# Patient Record
Sex: Female | Born: 1975
Health system: Southern US, Community
[De-identification: ages and names within clinical notes are randomized; demographics above are authoritative.]

## PROBLEM LIST (undated history)

## (undated) DIAGNOSIS — Z803 Family history of malignant neoplasm of breast: Secondary | ICD-10-CM

## (undated) DIAGNOSIS — K219 Gastro-esophageal reflux disease without esophagitis: Secondary | ICD-10-CM

## (undated) DIAGNOSIS — E119 Type 2 diabetes mellitus without complications: Secondary | ICD-10-CM

## (undated) DIAGNOSIS — N939 Abnormal uterine and vaginal bleeding, unspecified: Secondary | ICD-10-CM

## (undated) HISTORY — PX: OTHER SURGICAL HISTORY: SHX169

## (undated) HISTORY — DX: Abnormal uterine and vaginal bleeding, unspecified: N93.9

## (undated) HISTORY — DX: Family history of malignant neoplasm of breast: Z80.3

## (undated) HISTORY — DX: Type 2 diabetes mellitus without complications: E11.9

## (undated) HISTORY — DX: Gastro-esophageal reflux disease without esophagitis: K21.9

## (undated) HISTORY — PX: FOOT SURGERY: SHX648

## (undated) HISTORY — PX: CHOLECYSTECTOMY: SHX55

---

## 1998-05-23 ENCOUNTER — Inpatient Hospital Stay (HOSPITAL_COMMUNITY): Admission: AD | Admit: 1998-05-23 | Discharge: 1998-05-23 | Payer: Self-pay | Admitting: Obstetrics and Gynecology

## 1998-11-10 ENCOUNTER — Inpatient Hospital Stay (HOSPITAL_COMMUNITY): Admission: AD | Admit: 1998-11-10 | Discharge: 1998-11-13 | Payer: Self-pay | Admitting: Obstetrics and Gynecology

## 2000-08-08 ENCOUNTER — Emergency Department (HOSPITAL_COMMUNITY): Admission: EM | Admit: 2000-08-08 | Discharge: 2000-08-08 | Payer: Self-pay | Admitting: Emergency Medicine

## 2002-03-23 ENCOUNTER — Other Ambulatory Visit: Admission: RE | Admit: 2002-03-23 | Discharge: 2002-03-23 | Payer: Self-pay | Admitting: Family Medicine

## 2002-05-04 ENCOUNTER — Encounter: Payer: Self-pay | Admitting: Family Medicine

## 2002-05-04 ENCOUNTER — Ambulatory Visit (HOSPITAL_COMMUNITY): Admission: RE | Admit: 2002-05-04 | Discharge: 2002-05-04 | Payer: Self-pay | Admitting: Family Medicine

## 2002-09-22 ENCOUNTER — Inpatient Hospital Stay (HOSPITAL_COMMUNITY): Admission: AD | Admit: 2002-09-22 | Discharge: 2002-09-24 | Payer: Self-pay | Admitting: Family Medicine

## 2003-07-15 ENCOUNTER — Other Ambulatory Visit: Admission: RE | Admit: 2003-07-15 | Discharge: 2003-07-15 | Payer: Self-pay | Admitting: Family Medicine

## 2004-09-09 ENCOUNTER — Emergency Department (HOSPITAL_COMMUNITY): Admission: EM | Admit: 2004-09-09 | Discharge: 2004-09-09 | Payer: Self-pay | Admitting: Emergency Medicine

## 2004-09-10 ENCOUNTER — Encounter (INDEPENDENT_AMBULATORY_CARE_PROVIDER_SITE_OTHER): Payer: Self-pay | Admitting: *Deleted

## 2004-09-10 ENCOUNTER — Inpatient Hospital Stay (HOSPITAL_COMMUNITY): Admission: EM | Admit: 2004-09-10 | Discharge: 2004-09-11 | Payer: Self-pay

## 2004-09-16 ENCOUNTER — Inpatient Hospital Stay (HOSPITAL_COMMUNITY): Admission: EM | Admit: 2004-09-16 | Discharge: 2004-09-23 | Payer: Self-pay | Admitting: Emergency Medicine

## 2005-01-21 ENCOUNTER — Emergency Department (HOSPITAL_COMMUNITY): Admission: EM | Admit: 2005-01-21 | Discharge: 2005-01-21 | Payer: Self-pay | Admitting: Family Medicine

## 2005-02-08 ENCOUNTER — Encounter: Admission: RE | Admit: 2005-02-08 | Discharge: 2005-02-08 | Payer: Self-pay | Admitting: Family Medicine

## 2005-04-20 ENCOUNTER — Emergency Department (HOSPITAL_COMMUNITY): Admission: EM | Admit: 2005-04-20 | Discharge: 2005-04-20 | Payer: Self-pay | Admitting: Family Medicine

## 2005-05-03 ENCOUNTER — Other Ambulatory Visit: Admission: RE | Admit: 2005-05-03 | Discharge: 2005-05-03 | Payer: Self-pay | Admitting: Family Medicine

## 2005-11-26 ENCOUNTER — Encounter: Admission: RE | Admit: 2005-11-26 | Discharge: 2005-11-26 | Payer: Self-pay | Admitting: Family Medicine

## 2006-08-26 ENCOUNTER — Other Ambulatory Visit: Admission: RE | Admit: 2006-08-26 | Discharge: 2006-08-26 | Payer: Self-pay | Admitting: Family Medicine

## 2006-11-28 ENCOUNTER — Encounter: Admission: RE | Admit: 2006-11-28 | Discharge: 2006-11-28 | Payer: Self-pay | Admitting: Family Medicine

## 2007-01-07 ENCOUNTER — Encounter: Admission: RE | Admit: 2007-01-07 | Discharge: 2007-01-07 | Payer: Self-pay | Admitting: Family Medicine

## 2008-01-20 ENCOUNTER — Encounter: Admission: RE | Admit: 2008-01-20 | Discharge: 2008-01-20 | Payer: Self-pay | Admitting: Family Medicine

## 2010-05-09 ENCOUNTER — Encounter: Admission: RE | Admit: 2010-05-09 | Discharge: 2010-05-09 | Payer: Self-pay | Admitting: Family Medicine

## 2011-01-07 ENCOUNTER — Encounter: Payer: Self-pay | Admitting: Family Medicine

## 2011-05-04 NOTE — H&P (Signed)
NAMEGLENISHA, GUNDRY NO.:  [id number]   MEDICAL RECORD NO.:  [id number]          PATIENT TYPE:  INP   LOCATION:  0104                         FACILITY:  [facility]   PHYSICIAN:  Ollen Gross. Vernell Morgans, M.D. DATE OF BIRTH:  05/14/1976   DATE OF ADMISSION:  09/10/2004  DATE OF DISCHARGE:                                HISTORY & PHYSICAL   Ms. [name] is a 35 year old white female, who came to the emergency  department complaining of abdominal pain for the last couple of months.  Yesterday, she had a severe attack of the pain in her epigastric area.  It  did not radiate anywhere.  It was associated with some nausea but no  vomiting.  She denies any fevers.  The pain has improved a little bit since  she has been here with pain medicine.  Otherwise, denies any fevers, chills,  chest pain, shortness of breath, diarrhea, or dysuria.  The rest of her  review of systems was unremarkable.   PAST MEDICAL HISTORY:  Kidney being smaller than the other.   PAST SURGICAL HISTORY:  Cystoscopy as a child.   MEDICATIONS:  None.   ALLERGIES:  PENICILLIN.   SOCIAL HISTORY:  She denies the use of alcohol or tobacco products.   FAMILY HISTORY:  Significant for diabetes in her mother.   PHYSICAL EXAMINATION:  VITAL SIGNS:  She is afebrile with stable vitals.  GENERAL:  She is a well-developed, well-nourished white female in no acute  distress.  SKIN:  Warm and dry with no jaundice.  EYES:  Extraocular muscles are intact.  Pupils equal, round, and reactive to  light.  Sclerae nonicteric.  NECK:  No bruits.  I cannot palpate any thyroid masses.  The trachea is  midline.  LUNGS:  Clear bilaterally with no use of accessory respiratory muscles.  HEART:  Regular rate and rhythm with an impulse in left chest.  ABDOMEN:  Soft with some mild epigastric tenderness but no guarding or  peritoneal signs.  No palpable mass or hepatosplenomegaly.  EXTREMITIES:  No cyanosis, clubbing, or edema with good  strength in the arms  and legs.  PSYCHOLOGIC:  She is alert and oriented x 3 with no evidence of any anxiety  or depression.   LABORATORY DATA:  On review of her lab work, her white count was normal.  Her liver functions were normal.  Review of her ultrasound with the  radiologist showed stones in her gallbladder but no gallbladder wall  thickening or ductal dilatation.   ASSESSMENT AND PLAN:  This is a 35 year old white female with symptomatic  gallstones.  I do think she would benefit from having her gallbladder  removed, and she would also like to have this done.  I have explained to her  in detail the risks and benefits of the operation as well as some of the  technical aspects, and she understands and wishes to proceed.  We will plan  to do this for her in the operating room this afternoon.      PST/MEDQ  D:  09/10/2004  T:  09/10/2004  Job:  [id number]

## 2011-05-04 NOTE — Discharge Summary (Signed)
[name], [name] NO.:  [id number]   MEDICAL RECORD NO.:  [id number]          PATIENT TYPE:  INP   LOCATION:  0379                         FACILITY:  [facility]   PHYSICIAN:  Jackie Plum, M.D.DATE OF BIRTH:  Jun 21, 1976   DATE OF ADMISSION:  09/16/2004  DATE OF DISCHARGE:  09/23/2004                                 DISCHARGE SUMMARY   DISCHARGE DIAGNOSES:  1.  Abdominal pain, nausea, and vomiting presumed secondary to viral      gastroenteritis.  2.  Post ERCP acute pancreatitis, resolved.  3.  History of allergy to PENICILLIN.   DISCHARGE MEDICATIONS:  1.  Protonix 40 mg p.o. daily.  2.  Darvocet-N 100 one to two tablets p.o. q.4-6h. p.r.n.   ACTIVITY:  As tolerated.   DIET:  Low-fat diet. No greasy or oily foods.   FOLLOWUP APPOINTMENT:  The patient will follow up with Dr. Chevis Pretty as  previously scheduled. She is to follow up with Dr. Leone Payor at 10:15 a.m. on  October 09, 2004, at [facility].   DISCHARGE LABORATORY:  WBC count 8.4, hemoglobin 13.4, hematocrit 39.6, MCV  85.0, platelet count 203,000.  Protime 11.7, INR 0.8, PTT 34. Sodium 136,  potassium 3.9, chloride 106, CO2 27, glucose 102, BUN 4, creatinine 0.6,  total bilirubin 1.1, alkaline phosphatase 173, SGOT 23, SGOT 108, total  protein 7.0, albumin 3.1, amylase 25, lipase 52.   REASON FOR HOSPITALIZATION:  Abdominal pain, nausea, and vomiting.   The patient is 35 year old lady who was discharged from the hospital after  hospitalization for laparoscopic cholecystectomy. At that time at least  gallstone with a nondilated bile duct was seen on ultrasound. Her LFTs were  within normal limits. She did not get intraoperative cholangiogram because  of her normal LFTs. The patient presented to the hospital because of nausea,  vomiting, and abdominal pain which was epigastric with chills, and a brief  syncopal episode. She had elevated ALT of 35, total bilirubin 1.4, with  normal amylase and  lipase. She had a HIDA scan was negative for any leak or  biliary obstruction. The patient gives a history of a sick at home with  nausea and vomiting without any fever or diarrhea. She was subsequently  admitted for abdominal pain, nausea, and vomiting probably secondary to  gastroenteritis which was deemed Vera with subsequent orthostatic  hypertension being the cause of her syncope. She was started on IV  supplementation with antibiotic coverage. The patient was seen in  consultation by GI medicine on September 18, 2004, and in view of concerns for  possible routine common bile stone she had an ERCP done which turned out to  be unremarkable. This was followed with an MRCP which was also negative.  Post ERCP the patient continued to have abdominal pain with new elevated  pancreatic enzyme. She was diagnosed with post ERCP pancreatitis and she was  continued on a regimen of bowel rest and supportive measures. Her pancreatic  enzymes and metabolic panel were monitored subsequently with improvement in  her symptomatology at the time of discharge. Over the last 48  hours the  patient feels that her abdominal pain has been going down unremarkably and  has been tolerating food without any significant problems. She has been on a  low-fat diet over the last 24 hours with good tolerance and she is deemed  adequate for discharge today. On rounds today the patient does not have any  fever, chills, shortness of breath, chest pain, or dizziness. Her  temperature is 98.0 degrees Fahrenheit, heart rate 66, respirations 20,  blood pressure 136/70, 100% on room air. She looks well. She is clinically  well hydrated. Lungs are clear to auscultation bilaterally. Cardiac reveals  a regular rate and rhythm without any gallops or murmur. Abdomen is soft.  She has minimal tenderness in her epigastric area. She has normoactive bowel  sounds. Her extremities are negative for any cords or edema. She is alert  and  oriented times three. She is clear for discharge today. On morning round  today I spent time discussing with the patient her discharge diagnoses, all  the workup that was done in the hospital, the results, and the rationale  behind this workup. She also asked some questions regarding plan of care on  an outpatient level, which I elaborated on above and all her questions were  satisfactorily answered. I spent more than 32 minutes this morning preparing  this patient for discharge.     Geor   GO/MEDQ  D:  09/23/2004  T:  09/23/2004  Job:  [id number]   cc:   Magnus Sinning. Dimple Casey, M.D.  [address] [place]  [place] [address]  Fax: [phone]

## 2011-05-04 NOTE — Consult Note (Signed)
[name], [name] NO.:  [id number]   MEDICAL RECORD NO.:  [id number]          PATIENT TYPE:  INP   LOCATION:  0379                         FACILITY:  [facility]   PHYSICIAN:  Iva Boop, M.D. LHCDATE OF BIRTH:  1976-07-10   DATE OF CONSULTATION:  09/18/2004  DATE OF DISCHARGE:                                   CONSULTATION   REFERRED BY:  Melissa L. Ladona Ridgel, MD   REASON FOR CONSULTATION:  Abdominal pain after cholecystectomy, elevated  LFT's.   HISTORY:  This is a 35 year old white woman that had a laparoscopic  cholecystectomy performed by Dr. Carolynne Edouard and Zachery Dakins on September 25. Prior  to that, she had been having recurrent epigastric pain. She had at least one  gallstone with a nondilated bile duct on ultrasound at that time. Her LFT's  were normal. She had a successful uneventful cholecystectomy. No  intraoperative cholangiogram was performed because of normal LFT's.  Then  approximately two days ago, she develop nausea and vomiting with bilious  emesis, epigastric pain, chills and a brief syncopal episode ensued.  Temperature to 103.2.  Her LFT's were normal except for an ALT of 75 and  bilirubin 1.4, normal amylase and lipase.  A HIDA scan was performed which  was negative for any leak or biliary obstruction. At that point, her white  count was 9. Today her white count is 6, hemoglobin 12. Alkaline phosphatase  148, AST 88, ALT 235.  She says she feels somewhat better but still having  some pain and soreness in the epigastrium. She said this pain is similar to  what her perceived gallbladder pain was as well. She has had a headache and  since she has been hospitalized it has been shown that she has a sick child  home with nausea and vomiting but no fever or diarrhea. The patient herself  has not had diarrhea. She takes no regular medications.  In the hospital,  she is on Protonix 40 mg IV q.d., Lovenox 40 mg q.h.s. with last dose given  last night,  morphine p.r.n., Phenergan p.r.n.  Other p.r.n. medications as  listed in the flow sheet.   ALLERGIES:  PENICILLIN.  She had a rash to IVP DYE many years ago.   PAST MEDICAL AND SURGICAL HISTORY:  Otherwise as above.   FAMILY HISTORY:  Positive for diabetes and coronary artery disease.   SOCIAL HISTORY:  She is married, three children, no tobacco or alcohol.   REVIEW OF SYMPTOMS:  As above and otherwise all systems negative.   PHYSICAL EXAMINATION:  GENERAL:  Reveals a pleasant young white woman in no  acute distress though appears mildly ill.  VITAL SIGNS:  Temperature 98.2, blood pressure 110/70, pulse 60's.  HEENT:  Eyes anicteric, mouth posterior pharynx free of lesions.  NECK:  Supple.  CHEST:  Clear.  HEART:  S1, S2, no murmurs or gallops.  ABDOMEN:  Soft, mildly tender in the epigastrium without rebound or mass or  guarding.  EXTREMITIES:  No peripheral edema.  SKIN:  No rash.  Around the perioral area though she does  have multiple  blisters consistent with fever blisters/herpes blisters.   LABORATORY DATA:  As above.   ASSESSMENT:  A 35 year old white woman one week status post cholecystectomy  for symptomatic cholelithiasis.  She had an uneventful cholecystectomy and  was improving and then developed recurrent pain and fever. Not noted above,  there is some headache and some features consistent with possible viral  gastroenteritis but it is unclear as to whether or not she has a retained  common duct stone as well. She had at least one gallstone reported on  ultrasound, the pathology specimen did not show gallstones though it showed  cholesterolosis and chronic cholecystitis. The possibility of a bowel leak  remained as well as choledocholithiasis though bowel leak much less likely  given the negative HIDA scan.   PLAN:  1.  Proceed with ERCP to investigate for possible retained common duct stone      and the very unlikely possibility of leak after cholecystectomy.   2.  Further plans pending that result and clinical course.   I have explained the risks, benefits and indications of ERCP to the patient  and she understands and agrees to proceed.   I appreciate the opportunity to care for this patient.      CEG/MEDQ  D:  09/18/2004  T:  09/19/2004  Job:  [id number]   cc:   Melissa L. Ladona Ridgel, MD  Fax: [phone]   Tula Nakayama, M.D.

## 2011-05-04 NOTE — Op Note (Signed)
NAMEALERIA, MAHEU NO.:  [id number]   MEDICAL RECORD NO.:  [id number]          PATIENT TYPE:  INP   LOCATION:  0471                         FACILITY:  [facility]   PHYSICIAN:  Ollen Gross. Vernell Morgans, M.D. DATE OF BIRTH:  09-12-1976   DATE OF PROCEDURE:  09/10/2004  DATE OF DISCHARGE:  09/11/2004                                 OPERATIVE REPORT   PREOPERATIVE DIAGNOSIS:  Gallstones.   POSTOPERATIVE DIAGNOSIS:  Gallstones.   PROCEDURE:  Laparoscopic cholecystectomy.   SURGEON:  Ollen Gross. Carolynne Edouard, M.D.   ASSISTANT:  Anselm Pancoast. Zachery Dakins, M.D.   ANESTHESIA:  General endotracheal.   PROCEDURE:  After informed consent was obtained, the patient was brought to  the operating room and placed in a supine position on the operating room  table.  After adequate induction of general anesthesia, the patient's  abdomen was prepped with Betadine and draped in the usual sterile manner.  The area below the umbilicus was infiltrated with 0.25% Marcaine.  A small  incision was made with a 15 blade knife.  This incision was carried down  through the subcutaneous tissue bluntly with a Kelly clamp and [employer]  retractors until the linea alba was identified.  The linea alba was incised  with a 15 blade knife, and each side was grasped with Kocher clamps and  elevated anteriorly.  The pre-peritoneal space was probed bluntly with a  hemostat until the peritoneum was opened, and access was gained to the  abdominal cavity.  A 0 Vicryl purse-string stitch was placed in the fascial  surrounding the opening.  A Hasson cannula was placed through the opening  and anchored in place with the previously placed Vicryl purse-string stitch.  The abdomen was then insufflated with carbon dioxide without difficulty.  The patient was placed in a head-up position and rotated slightly with the  right side up.  The laparoscope was inserted through the Hasson cannula, and  the right upper quadrant was inspected  with the dome of the gallbladder and  liver readily identified.  Next, the epigastric region was infiltrated with  0.25% Marcaine.  A small incision was made with a 15 blade knife, and a 10  mm port was placed bluntly through this incision into the abdominal cavity  under direct vision.  Next, sites were chosen laterally on the right side of  the abdomen with placement of 5 mm ports.  Each of these areas were  infiltrated with 0.25% Marcaine.  Small stab incisions were made with a 15  blade knife, and 5 mm ports were placed bluntly through these incisions into  the abdominal cavity under direct vision.  A blunt grasper was placed  through the lateral-most 5 mm port and used to grasp the dome of the  gallbladder and elevate it anteriorly and superiorly.  Another blunt grasper  was placed through the other 5 mm port and used to retract on the body and  neck of the gallbladder.  A dissector was placed through the epigastric  port.  Using electrocautery, the peritoneal reflection at the gallbladder  neck area was  opened.  Blunt dissection was then carried down into this area  until the gallbladder neck and cystic duct junction was readily identified,  and a good window was created.  The anatomy was easy to see, and care was  taken to keep the common duct medial to this dissection.  The patient had an  IV dye allergy, and since the anatomy was easy to see and her liver  functions were normal, it was decided to forgo the cholangiogram.  Two clips  were placed proximal and distally on the cystic duct, and the duct was  divided between the two sets of clips.  Posterior to this, the cystic artery  was identified and again dissected bluntly in a circumferential manner until  a good window was created.  Two clips were placed proximally, one distally  on the artery, and the artery was divided between the two.  Next, the  laparoscopic hook cautery device was used to separate the gallbladder from  the  liver bed.  Prior to completely detaching the gallbladder from the liver  bed, the liver bed was inspected, and several small bleeding points were  coagulated with the electrocautery until the area was completely hemostatic.  The gallbladder was then detached the rest of the way from the liver bed  using the electrocautery without difficulty.  The laparoscope was then moved  to the epigastric port.  A gallbladder grasper was placed through the Hasson  cannula and used to grasp the neck of the gallbladder.  The gallbladder was  then removed with the Hasson cannula  through the infraumbilical port  without difficulty.  The fascial defect was closed with the previously  placed Vicryl purse-string stitch as well as with another interrupted 0  Vicryl stitch.  The abdomen was then irrigated with copious amounts of  saline until the effluent was clear.  The liver bed was inspected again and  found to be hemostatic.  The ports were then all removed under direct vision  and were found to be hemostatic.  The gas was allowed to escape.  The skin  incisions were all closed with interrupted 4-0 Monocryl subcuticular  stitches.  Benzoin, Steri-Strips, and sterile dressings were applied.  The  patient tolerated the procedure well.  At the end of the case, all needle,  sponge, and instrument counts were correct.  Patient was then awakened and  taken to the recovery room in stable condition.      PST/MEDQ  D:  09/11/2004  T:  09/11/2004  Job:  [id number]

## 2011-05-04 NOTE — H&P (Signed)
[name], [name] NO.:  [id number]   MEDICAL RECORD NO.:  [id number]          PATIENT TYPE:  INP   LOCATION:  0379                         FACILITY:  [facility]   PHYSICIAN:  Corinna L. Lendell Caprice, MDDATE OF BIRTH:  [birth year]   DATE OF ADMISSION:  09/16/2004  DATE OF DISCHARGE:                                HISTORY & PHYSICAL   CHIEF COMPLAINT:  Weakness.   HISTORY OF PRESENT ILLNESS:  [name] is a 35 year old unassigned patient  who presents to the emergency room with her husband.  She had a laparoscopic  cholecystectomy on September 10, 2004, did well postoperatively until this  morning.  She reports that she had bilious emesis several times today and  some epigastric pain.  She also had chills and went to soak in a hot  bathtub.  While in the bathtub, she became so weak that when her husband  tried to assist her out of the bath she had a syncopal episode lasting  approximately 5-10 seconds.  He reports that her eyes rolled back into her  head, and she quickly regained consciousness.  She has had no sick contacts.  She has not eaten any foods which could have been the culprit.  She is also  complaining of headaches since her fever started.   PAST MEDICAL HISTORY:  As above.  Otherwise negative.   MEDICATIONS:  Tylox as needed.   SOCIAL HISTORY:  The patient works on an Theatre stage manager at [employer].  She is the  mother of three and here with her husband.  She does not smoke, drink, or  use drugs.   FAMILY HISTORY:  Her mother has diabetes, peripheral vascular disease,  coronary artery disease.  She does not know her father.   REVIEW OF SYSTEMS:  As above.  Otherwise negative.   PHYSICAL EXAMINATION:  TEMPERATURE:  103.4.  BLOOD PRESSURE/HEART RATE:  Lying, 103/64, with a heart rate of 107.  Standing, her blood pressure dropped to 85/55, and her heart rate increased  to 130.  RESPIRATORY RATE:  22.  OXYGEN SATURATION:  99%.  GENERAL:  The patient is an  uncomfortable-appearing white female.  HEENT:  Normocephalic, atraumatic.  Pupils equal, round, and reactive to  light.  Conjunctivae are normal.  She has slightly dry mucous membranes.  Ketotic breath.  NECK:  Supple.  No lymphadenopathy.  LUNGS:  Clear to auscultation bilaterally, without wheezes, rhonchi, or  rales.  CARDIOVASCULAR:  Regular rate and rhythm, without murmurs, gallops, or rubs.  ABDOMEN:  She has several trocar incisions which do not have any drainage or  erythema.  Her abdomen is soft, with some slight lower abdominal tenderness.  No rebound tenderness.  No guarding.  GU/RECTAL:  Deferred.  EXTREMITIES:  No clubbing, cyanosis, or edema.  SKIN:  She has some redness about the neck, but no rash per se.  NEUROLOGIC:  The patient is somewhat groggy after nausea medicines, but  oriented x 3.  Cranial nerves and sensorimotor exam are intact.  PSYCHIATRIC:  Normal affect.   LABORATORIES:  CBC is unremarkable.  Complete metabolic panel is  significant  for SGPT 75, total bilirubin 1.4, otherwise fairly unremarkable.  UA is  negative.  Chest x-ray negative.  HIDA scan shows no bile leak or biliary  obstruction.   ASSESSMENT AND PLAN:  1.  Fever and vomiting, with syncope due to orthostatic hypotension:  The      patient's urinalysis and chest x-ray are negative.  I suspect this is a      viral gastroenteritis.  However, I will check blood cultures and a      lipase.  As she is still very weak and cannot even sit up without      assistance, she will be admitted, get IV fluids, antiemetics.  2.  Recent laparoscopic cholecystectomy, with a normal HIDA scan and benign      abdominal exam.  Apparently, Dr. Jennette Kettle has called Dr. Luan Pulling, who      is on call for Dr. Carolynne Edouard and agrees that this not surgical.     Cori   CLS/MEDQ  D:  09/16/2004  T:  09/17/2004  Job:  [id number]   cc:   Ollen Gross. Vernell Morgans, M.D.  [address]  [place]  [place] [address]  Fax: [phone]

## 2011-08-30 ENCOUNTER — Other Ambulatory Visit: Payer: Self-pay | Admitting: Obstetrics

## 2011-08-30 DIAGNOSIS — Z1231 Encounter for screening mammogram for malignant neoplasm of breast: Secondary | ICD-10-CM

## 2011-09-05 ENCOUNTER — Ambulatory Visit
Admission: RE | Admit: 2011-09-05 | Discharge: 2011-09-05 | Disposition: A | Discharge status: Home / Self Care | Payer: BC Managed Care – PPO | Source: Ambulatory Visit | Attending: Obstetrics | Admitting: Obstetrics

## 2011-09-05 DIAGNOSIS — Z1231 Encounter for screening mammogram for malignant neoplasm of breast: Secondary | ICD-10-CM

## 2012-03-12 ENCOUNTER — Ambulatory Visit
Admission: RE | Admit: 2012-03-12 | Discharge: 2012-03-12 | Disposition: A | Discharge status: Home / Self Care | Payer: 59 | Source: Ambulatory Visit | Attending: Family Medicine | Admitting: Family Medicine

## 2012-03-12 ENCOUNTER — Other Ambulatory Visit: Payer: Self-pay | Admitting: Family Medicine

## 2012-09-30 ENCOUNTER — Ambulatory Visit: Payer: 59 | Attending: Podiatry | Admitting: Physical Therapy

## 2012-09-30 DIAGNOSIS — R5381 Other malaise: Secondary | ICD-10-CM | POA: Insufficient documentation

## 2012-09-30 DIAGNOSIS — IMO0001 Reserved for inherently not codable concepts without codable children: Secondary | ICD-10-CM | POA: Insufficient documentation

## 2012-09-30 DIAGNOSIS — M25579 Pain in unspecified ankle and joints of unspecified foot: Secondary | ICD-10-CM | POA: Insufficient documentation

## 2012-09-30 DIAGNOSIS — R269 Unspecified abnormalities of gait and mobility: Secondary | ICD-10-CM | POA: Insufficient documentation

## 2012-10-03 ENCOUNTER — Ambulatory Visit: Payer: 59 | Admitting: Physical Therapy

## 2012-10-10 ENCOUNTER — Ambulatory Visit: Payer: 59 | Admitting: Physical Therapy

## 2012-10-13 ENCOUNTER — Ambulatory Visit: Payer: 59 | Admitting: Physical Therapy

## 2012-10-16 ENCOUNTER — Ambulatory Visit: Payer: 59 | Admitting: Physical Therapy

## 2012-10-21 ENCOUNTER — Encounter: Payer: 59 | Admitting: Physical Therapy

## 2012-10-22 ENCOUNTER — Ambulatory Visit: Payer: 59 | Attending: Podiatry | Admitting: Physical Therapy

## 2012-10-22 DIAGNOSIS — R269 Unspecified abnormalities of gait and mobility: Secondary | ICD-10-CM | POA: Insufficient documentation

## 2012-10-22 DIAGNOSIS — IMO0001 Reserved for inherently not codable concepts without codable children: Secondary | ICD-10-CM | POA: Insufficient documentation

## 2012-10-22 DIAGNOSIS — R5381 Other malaise: Secondary | ICD-10-CM | POA: Insufficient documentation

## 2012-10-22 DIAGNOSIS — M25579 Pain in unspecified ankle and joints of unspecified foot: Secondary | ICD-10-CM | POA: Insufficient documentation

## 2012-10-28 ENCOUNTER — Ambulatory Visit: Payer: 59 | Admitting: Physical Therapy

## 2012-10-30 ENCOUNTER — Encounter: Payer: 59 | Admitting: Physical Therapy

## 2012-11-24 ENCOUNTER — Ambulatory Visit: Payer: 59 | Attending: Podiatry | Admitting: Physical Therapy

## 2012-11-24 DIAGNOSIS — IMO0001 Reserved for inherently not codable concepts without codable children: Secondary | ICD-10-CM | POA: Insufficient documentation

## 2012-11-24 DIAGNOSIS — M25579 Pain in unspecified ankle and joints of unspecified foot: Secondary | ICD-10-CM | POA: Insufficient documentation

## 2012-11-24 DIAGNOSIS — R5381 Other malaise: Secondary | ICD-10-CM | POA: Insufficient documentation

## 2012-11-24 DIAGNOSIS — R269 Unspecified abnormalities of gait and mobility: Secondary | ICD-10-CM | POA: Insufficient documentation

## 2012-11-27 ENCOUNTER — Ambulatory Visit: Payer: 59 | Admitting: Physical Therapy

## 2012-12-01 ENCOUNTER — Ambulatory Visit: Payer: 59 | Admitting: *Deleted

## 2012-12-05 ENCOUNTER — Encounter: Payer: 59 | Admitting: *Deleted

## 2012-12-08 ENCOUNTER — Ambulatory Visit: Payer: 59 | Admitting: *Deleted

## 2012-12-12 ENCOUNTER — Ambulatory Visit: Payer: 59 | Admitting: Physical Therapy

## 2012-12-15 ENCOUNTER — Ambulatory Visit: Payer: 59 | Admitting: *Deleted

## 2013-11-06 ENCOUNTER — Encounter (INDEPENDENT_AMBULATORY_CARE_PROVIDER_SITE_OTHER): Payer: Self-pay

## 2013-11-06 ENCOUNTER — Ambulatory Visit (INDEPENDENT_AMBULATORY_CARE_PROVIDER_SITE_OTHER): Payer: Managed Care, Other (non HMO)

## 2013-11-06 ENCOUNTER — Ambulatory Visit (INDEPENDENT_AMBULATORY_CARE_PROVIDER_SITE_OTHER): Payer: Managed Care, Other (non HMO) | Admitting: Family Medicine

## 2013-11-06 ENCOUNTER — Encounter: Payer: Self-pay | Admitting: Family Medicine

## 2013-11-06 VITALS — BP 118/74 | HR 63 | Temp 97.9°F | Ht 68.0 in | Wt 239.0 lb

## 2013-11-06 DIAGNOSIS — M25571 Pain in right ankle and joints of right foot: Secondary | ICD-10-CM

## 2013-11-06 DIAGNOSIS — M25579 Pain in unspecified ankle and joints of unspecified foot: Secondary | ICD-10-CM

## 2013-11-06 MED ORDER — PREDNISONE 50 MG PO TABS: ORAL_TABLET | ORAL | Status: DC

## 2013-11-06 NOTE — Progress Notes (Signed)
  Subjective:    Patient ID: [name], female    DOB: 1976/02/18, 37 y.o.   MRN: [id number]  HPI  R foot/ankle pain x 2-3 months.  Pt reports a prior hx/o R heel spurs s/p surgery  Patient states that she have recurrence of pain about 6 months after the surgery. Patient has been seen by other specialists as well as had therapeutic injections to the right heel with minimal improvement in pain. Patient does report that she stands on her feet for greater than 12 hours of time at her job. Over the past 2 months she's had progressive right heel pain that has radiated into the medial ankle with some first toe  Tingling Has been able to bear weight. Has been using over-the-counter NSAIDs with minimal small improvement in symptoms. Does have custom orthotics in place. Her last discussion with Dr. Allena Katz included the option of the patient likely needing surgery again. .Review of Systems  All other systems reviewed and are negative.       Objective:   Physical Exam  Constitutional: She appears well-developed and well-nourished.  HENT:  Head: Normocephalic and atraumatic.  Eyes: Conjunctivae are normal. Pupils are equal, round, and reactive to light.  Neck: Normal range of motion.  Cardiovascular: Normal rate and regular rhythm.   Pulmonary/Chest: Effort normal and breath sounds normal.  Abdominal: Soft.  Musculoskeletal:       Feet:  + TTP over affected area  + TTP over plantar fascia with dorsiflexion of toes.  Mild TTP over distribution of tarsal tunnel Full ROM  Neurovascularly inta  Neurological: She is alert.  Skin: Skin is warm.   [facility] reading (PRIMARY) by  Dr. Alvester Morin  Preliminary with noted heel spur. No acute fractures or dislocations preliminarily noted.                                          Assessment & Plan:  Pain in joint, ankle and foot, right - Plan: DG Foot Complete Right, Ambulatory referral to Podiatry, predniSONE (DELTASONE) 50 MG tablet  DDx for  sxs is fairly broad including post surgical issue, plantar fasciitis, tarsal tunnel, heel spur recurrence.  Patient has had a fairly extensive workup as well as management of this issue in the past. Will place patient on short course of prednisone for symptomatic relief. Plan for followup with orthopedics. A second operation was a discussion at her last visit with orthopedics. May benefit long-term from physical therapy  nerve modulating medication like Neurontin. Patient expressed understanding and is agreeable to plan Followup as needed.

## 2014-08-30 ENCOUNTER — Telehealth: Payer: Self-pay | Admitting: Family Medicine

## 2014-08-30 NOTE — Telephone Encounter (Signed)
appt rescheduled for 9/29 with [facility]

## 2014-09-14 ENCOUNTER — Ambulatory Visit (INDEPENDENT_AMBULATORY_CARE_PROVIDER_SITE_OTHER): Payer: BC Managed Care – PPO | Admitting: Family Medicine

## 2014-09-14 ENCOUNTER — Encounter: Payer: Self-pay | Admitting: Family Medicine

## 2014-09-14 VITALS — BP 119/80 | HR 74 | Temp 97.8°F | Ht 67.0 in | Wt 252.0 lb

## 2014-09-14 DIAGNOSIS — Z139 Encounter for screening, unspecified: Secondary | ICD-10-CM

## 2014-09-14 DIAGNOSIS — R635 Abnormal weight gain: Secondary | ICD-10-CM

## 2014-09-14 DIAGNOSIS — Z0000 Encounter for general adult medical examination without abnormal findings: Secondary | ICD-10-CM

## 2014-09-14 LAB — GLUCOSE, POCT (MANUAL RESULT ENTRY): POC Glucose: 101 mg/dl — AB (ref 70–99)

## 2014-09-14 LAB — POCT GLYCOSYLATED HEMOGLOBIN (HGB A1C): HEMOGLOBIN A1C: 5.8

## 2014-09-14 NOTE — Patient Instructions (Signed)

## 2014-09-14 NOTE — Progress Notes (Signed)
Subjective:    Patient ID: [name], female    DOB: [birth year], 38 y.o.   MRN: [id number]  HPI  38 yr old female for physical as per her insurance company.  No complaints.  Has FH of breast cancer and has been having mammograms for some years.    Review of Systems  Constitutional: Negative.   HENT: Negative.   Eyes: Negative.   Respiratory: Negative.   Cardiovascular: Negative.   Gastrointestinal: Negative.   Endocrine: Negative.   Genitourinary: Negative.   Musculoskeletal: Positive for myalgias.       Bilateral thumb pain  Skin: Rash: not true rash but healing intertrigo.  Hematological: Negative.   Psychiatric/Behavioral: Negative.        Objective:   Physical Exam  Constitutional: She is oriented to person, place, and time. She appears well-developed and well-nourished.  Eyes: Conjunctivae and EOM are normal.  Neck: Normal range of motion. Neck supple.  Cardiovascular: Normal rate, regular rhythm and normal heart sounds.   Pulmonary/Chest: Effort normal and breath sounds normal.  Abdominal: Soft. Bowel sounds are normal.  Musculoskeletal: Normal range of motion.  Neurological: She is alert and oriented to person, place, and time. She has normal reflexes.  Skin: Skin is warm and dry.  Psychiatric: She has a normal mood and affect. Her behavior is normal. Thought content normal.    BP 119/80  Pulse 74  Temp(Src) 97.8 F (36.6 C) (Oral)  Ht 5\' 7"  (1.702 m)  Wt 252 lb (114.306 kg)  BMI 39.46 kg/m2  LMP 09/02/2014      Assessment & Plan:  1. Screening Besides obesity seems healthy - POCT glycosylated hemoglobin (Hb A1C) - POCT glucose (manual entry) - Lipi  Patient ID: [name] MRN: [id number], DOB: [birth year], 38 y.o. Date of Encounter: 09/14/2014, 2:42 PM  Primary Physician: Frederica KusterMILLER, STEPHEN M, MD  Chief Complaint:   HPI: 38 y.o. year old female with history below presents with    No past medical history on file.   Home Meds: Prior  to Admission medications   Not on File    Allergies:  Allergies  Allergen Reactions  . Ivp Dye [Iodinated Diagnostic Agents]   . Penicillins     History   Social History  . Marital Status: Divorced    Spouse Name: N/A    Number of Children: N/A  . Years of Education: N/A   Occupational History  . Not on file.   Social History Main Topics  . Smoking status: Never Smoker   . Smokeless tobacco: Not on file  . Alcohol Use: No  . Drug Use: No  . Sexual Activity: Not on file   Other Topics Concern  . Not on file   Social History Narrative  . No narrative on file     Review of Systems: Constitutional: negative for chills, fever, night sweats, weight changes, or fatigue  HEENT: negative for vision changes, hearing loss, congestion, rhinorrhea, ST, epistaxis, or sinus pressure Cardiovascular: negative for chest pain or palpitations Respiratory: negative for hemoptysis, wheezing, shortness of breath, or cough Abdominal: negative for abdominal pain, nausea, vomiting, diarrhea, or constipation Dermatological: negative for rash Neurologic: negative for headache, dizziness, or syncope All other systems reviewed and are otherwise negative with the exception to those above and in the HPI.   Physical Exam: Blood pressure 119/80, pulse 74, temperature 97.8 F (36.6 C), temperature source Oral, height 5\' 7"  (1.702 m), weight 252 lb (114.306 kg), last menstrual period 09/02/2014.,  Body mass index is 39.46 kg/(m^2). General: Well developed, well nourished, in no acute distress. Head: Normocephalic, atraumatic, eyes without discharge, sclera non-icteric, nares are without discharge. Bilateral auditory canals clear, TM's are without perforation, pearly grey and translucent with reflective cone of light bilaterally. Oral cavity moist, posterior pharynx without exudate, erythema, peritonsillar abscess, or post nasal drip.  Neck: Supple. No thyromegaly. Full ROM. No lymphadenopathy. Lungs:  Clear bilaterally to auscultation without wheezes, rales, or rhonchi. Breathing is unlabored. Heart: RRR with S1 S2. No murmurs, rubs, or gallops appreciated. Abdomen: Soft, non-tender, non-distended with normoactive bowel sounds. No hepatomegaly. No rebound/guarding. No obvious abdominal masses. Msk:  Strength and tone normal for age. Extremities/Skin: Warm and dry. No clubbing or cyanosis. No edema. No rashes or suspicious lesions. Neuro: Alert and oriented X 3. Moves all extremities spontaneously. Gait is normal. CNII-XII grossly in tact. Psych:  Responds to questions appropriately with a normal affect.   Labs:   ASSESSMENT AND PLAN:  38 y.o. year old female with    09/14/2014 2:42 PM

## 2014-09-15 ENCOUNTER — Telehealth: Payer: Self-pay | Admitting: Family Medicine

## 2014-09-15 LAB — LIPID PANEL
CHOL/HDL RATIO: 3.1 ratio (ref 0.0–4.4)
Cholesterol, Total: 132 mg/dL (ref 100–199)
HDL: 42 mg/dL (ref >39)
LDL CALC: 77 mg/dL (ref 0–99)
TRIGLYCERIDES: 65 mg/dL (ref 0–149)
VLDL Cholesterol Cal: 13 mg/dL (ref 5–40)

## 2014-09-15 NOTE — Telephone Encounter (Signed)
Pt aware of lab results and needs results faxed to [phone].  Today is the deadline for these results to be faxed.  Call Candida to let her know this has been [phone]--[phone].   rs

## 2014-09-15 NOTE — Telephone Encounter (Signed)
Patient aware has been faxed.

## 2014-09-15 NOTE — Telephone Encounter (Signed)
Message copied by Azalee CourseFULP, ASHLEY on Wed Sep 15, 2014  9:39 AM ------      Message from: Frederica KusterMILLER, STEPHEN M      Created: Tue Sep 14, 2014  4:30 PM       Both blood sugar and hemoglobin A1c are consistent with prediabetes diagnosis. Patient should be counseled to watch her carbohydrate intake and lose weight. We have discussed this at her visit. ------

## 2014-09-16 ENCOUNTER — Telehealth: Payer: Self-pay | Admitting: Family Medicine

## 2014-09-16 ENCOUNTER — Encounter: Payer: Managed Care, Other (non HMO) | Admitting: Family Medicine

## 2014-09-16 NOTE — Telephone Encounter (Signed)
Message copied by Azalee CourseFULP, Mirayah Wren on Thu Sep 16, 2014  8:33 AM ------      Message from: Frederica KusterMILLER, STEPHEN M      Created: Thu Sep 16, 2014  8:00 AM       Lipids are good; no treatment not needed ------

## 2015-11-17 ENCOUNTER — Ambulatory Visit (INDEPENDENT_AMBULATORY_CARE_PROVIDER_SITE_OTHER): Payer: BLUE CROSS/BLUE SHIELD | Admitting: Pediatrics

## 2015-11-17 ENCOUNTER — Encounter: Payer: Self-pay | Admitting: Pediatrics

## 2015-11-17 VITALS — BP 123/79 | HR 75 | Temp 97.5°F | Ht 67.0 in | Wt 250.0 lb

## 2015-11-17 DIAGNOSIS — Z6839 Body mass index (BMI) 39.0-39.9, adult: Secondary | ICD-10-CM | POA: Diagnosis not present

## 2015-11-17 DIAGNOSIS — R739 Hyperglycemia, unspecified: Secondary | ICD-10-CM

## 2015-11-17 DIAGNOSIS — Z0000 Encounter for general adult medical examination without abnormal findings: Secondary | ICD-10-CM

## 2015-11-17 DIAGNOSIS — Z6838 Body mass index (BMI) 38.0-38.9, adult: Secondary | ICD-10-CM

## 2015-11-17 DIAGNOSIS — E669 Obesity, unspecified: Secondary | ICD-10-CM | POA: Insufficient documentation

## 2015-11-17 NOTE — Progress Notes (Signed)
    Subjective:    Patient ID: [name], female    DOB: 01-13-1976, 39 y.o.   MRN: [id number]  CC: [name]  HPI: [name] is a 39 y.o. female presenting for [name]   Mom died 2 weeks ago from breast ca Pt's mood doing ok  Family has been very supportive Here for [name] today No other complaints Mom had breast ca at 27yo Mammogram UTD, got over the summer.  Pap smear this year apprx 05/2015 No fam hx of colon ca Exercise--walking regularly, also member at the gym but doesn't go regularly Eating fruits and vegetables Never smoker   Depression screen [facility] 2/9 11/17/2015 09/14/2014  Decreased Interest 0 0  Down, Depressed, Hopeless 0 0  PHQ - 2 Score 0 0   History  Smoking status  . Never Smoker   Smokeless tobacco  . Not on file    Relevant past medical, surgical, family and social history reviewed and updated as indicated. Interim medical history since our last visit reviewed. Allergies and medications reviewed and updated.    ROS: All systems negative other than what is in HPI  Past Medical History Patient Active Problem List   Diagnosis Date Noted  . BMI 39.0-39.9,adult 11/17/2015    Current Outpatient Prescriptions  Medication Sig Dispense Refill  . fexofenadine (ALLEGRA) 180 MG tablet Take 180 mg by mouth.     No current facility-administered medications for this visit.       Objective:    BP 123/79 mmHg  Pulse 75  Temp(Src) 97.5 F (36.4 C) (Oral)  Ht $R'5\' 7"'Nr$  (1.702 m)  Wt 250 lb (113.399 kg)  BMI 39.15 kg/m2  Wt Readings from Last 3 Encounters:  11/17/15 250 lb (113.399 kg)  09/14/14 252 lb (114.306 kg)  11/06/13 239 lb (108.41 kg)    Gen: NAD, alert, cooperative with exam, NCAT EYES: EOMI, no scleral injection or icterus ENT:  TMs pearly gray b/l, OP without erythema LYMPH: no cervical LAD CV: NRRR, normal S1/S2, no murmur, distal pulses 2+ b/l Resp: CTABL, no wheezes, normal WOB Abd: +BS, soft, NTND. no guarding or organomegaly Ext: No  edema, warm Neuro: Alert and oriented, strength equal b/l UE and LE, coordination grossly normal MSK: normal muscle bulk     Assessment & Plan:    [name] was seen today for [name]. Overall doing well. Working on decreasing weight through healthy lifestyles. Discussed strategies with pt.  Diagnoses and all orders for this visit:  Encounter for preventive health examination -     Lipid panel -     BMP8+EGFR  BMI 39.0-39.9,adult    Follow up plan: Return in about 1 year (around 11/16/2016).  Assunta Found, MD [facility] 11/17/2015, 10:38 AM

## 2015-11-18 LAB — BMP8+EGFR
BUN / CREAT RATIO: 18 (ref 9–23)
BUN: 11 mg/dL (ref 6–24)
CO2: 22 mmol/L (ref 18–29)
Calcium: 9 mg/dL (ref 8.7–10.2)
Chloride: 98 mmol/L (ref 97–106)
Creatinine, Ser: 0.61 mg/dL (ref 0.57–1.00)
GFR calc Af Amer: 131 mL/min/{1.73_m2} (ref >59)
GFR calc non Af Amer: 114 mL/min/{1.73_m2} (ref >59)
GLUCOSE: 146 mg/dL — AB (ref 65–99)
POTASSIUM: 4.2 mmol/L (ref 3.5–5.2)
SODIUM: 135 mmol/L — AB (ref 136–144)

## 2015-11-18 LAB — LIPID PANEL
Chol/HDL Ratio: 3.3 ratio units (ref 0.0–4.4)
Cholesterol, Total: 147 mg/dL (ref 100–199)
HDL: 44 mg/dL (ref >39)
LDL CALC: 87 mg/dL (ref 0–99)
Triglycerides: 78 mg/dL (ref 0–149)
VLDL CHOLESTEROL CAL: 16 mg/dL (ref 5–40)

## 2015-11-24 NOTE — Addendum Note (Signed)
Addended by: Johna SheriffVINCENT, Sharalee Witman L on: 11/24/2015 08:14 AM   Modules accepted: Orders

## 2015-11-25 ENCOUNTER — Telehealth: Payer: Self-pay | Admitting: Pediatrics

## 2015-11-25 NOTE — Telephone Encounter (Signed)
Stp and advised she doesn't need an appt and it's not a blood draw but a quick finger stick for the HGB A1C. Pt voiced understanding.

## 2015-12-05 ENCOUNTER — Other Ambulatory Visit: Payer: BLUE CROSS/BLUE SHIELD

## 2015-12-05 LAB — POCT GLYCOSYLATED HEMOGLOBIN (HGB A1C): HEMOGLOBIN A1C: 6.1

## 2015-12-05 NOTE — Progress Notes (Signed)
Lab only 

## 2016-02-22 ENCOUNTER — Ambulatory Visit (INDEPENDENT_AMBULATORY_CARE_PROVIDER_SITE_OTHER): Payer: BLUE CROSS/BLUE SHIELD | Admitting: Family Medicine

## 2016-02-22 ENCOUNTER — Encounter: Payer: Self-pay | Admitting: Family Medicine

## 2016-02-22 VITALS — BP 125/75 | HR 71 | Temp 98.4°F | Ht 67.0 in | Wt 255.4 lb

## 2016-02-22 DIAGNOSIS — J0130 Acute sphenoidal sinusitis, unspecified: Secondary | ICD-10-CM

## 2016-02-22 DIAGNOSIS — H6503 Acute serous otitis media, bilateral: Secondary | ICD-10-CM | POA: Diagnosis not present

## 2016-02-22 MED ORDER — CEFDINIR 300 MG PO CAPS
300.0000 mg | ORAL_CAPSULE | Freq: Two times a day (BID) | ORAL | Status: DC
Start: 2016-02-22 — End: 2016-10-25

## 2016-02-22 NOTE — Progress Notes (Signed)
Patient ID: [name], female   DOB: 12-23-1975, 40 y.o.   MRN: [id number]  Primary Physician: Frederica Kuster, MD  Chief Complaint: 40 year old female with a three-week history of right ear pressure and pain in the back of her head on the right side. The left ear also has some fullness. She has some mild cough but the pressure in her right ear and head is most concerning. She has been taking Allegra without relief.      History reviewed. No pertinent past medical history.   Home Meds: Prior to Admission medications   Medication Sig Start Date End Date Taking? Authorizing Provider  fexofenadine (ALLEGRA) 180 MG tablet Take 180 mg by mouth.   Yes Historical Provider, MD    Allergies:  Allergies  Allergen Reactions  . Ivp Dye [Iodinated Diagnostic Agents]   . Penicillins     Social History   Social History  . Marital Status: Divorced    Spouse Name: N/A  . Number of Children: N/A  . Years of Education: N/A   Occupational History  . Not on file.   Social History Main Topics  . Smoking status: Never Smoker   . Smokeless tobacco: Not on file  . Alcohol Use: No  . Drug Use: No  . Sexual Activity: Not on file   Other Topics Concern  . Not on file   Social History Narrative     Review of Systems Constitutional: negative for chills, fever, night sweats, weight changes, or fatigue  HEENT: negative for vision changes, hearing loss, congestion, rhinorrhea, ST, epistaxis,has sinus pressure Cardiovascular: negative for chest pain or palpitations Respiratory: negative for hemoptysis, wheezing, shortness of breath, or cough Abdominal: negative for abdominal pain, nausea, vomiting, diarrhea, or constipation Dermatological: negative for rash Neurologic: negative for headache, dizziness, or syncope All other systems reviewed and are otherwise negative with the exception to those above and in the HPI.   Physical Exam: Blood pressure 125/75, pulse 71, temperature 98.4 F  (36.9 C), temperature source Oral, height  (1.702 m), weight 255 lb 6.4 oz (115.849 kg), last menstrual period 02/20/2016., Body mass index is 39.99 kg/(m^2). General: Well developed, well nourished, in no acute distress. Head: Normocephalic, atraumatic, eyes without discharge, sclera non-icteric, nares are without discharge. Bilateral auditory canals clear, TM's are without perforation, pearly grey and translucent without reflective cone of light bilaterally. Oral cavity moist, posterior pharynx without exudate, erythema, peritonsillar abscess, or post nasal drip.  Neck: Supple. No thyromegaly. Full ROM. No lymphadenopathy. Lungs: Clear bilaterally to auscultation without wheezes, rales, or rhonchi. Breathing is unlabored. Heart: RRR with S1 S2. No murmurs, rubs, or gallops appreciated. Abdomen: Soft, non-tender, non-distended with normoactive bowel sounds. No hepatomegaly. No rebound/guarding. No obvious abdominal masses. Msk:  Strength and tone normal for age. Extremities/Skin: Warm and dry. No clubbing or cyanosis. No edema. No rashes or suspicious lesions. Neuro: Alert and oriented X 3. Moves all extremities spontaneously. Gait is normal. CNII-XII grossly in tact. Psych:  Responds to questions appropriately with a normal affect.   Labs:   ASSESSMENT AND PLAN:   1. Bilateral acute serous otitis media, recurrence not specified Patient is to do ear exercises (Valsalva). Begin Flonase and because she has pain in her ear will treat with Omnicef.  2. Acute sphenoidal sinusitis, recurrence not specified Pain in the occipital area of her head suggest possible sphenoid sinusitis but pain may also be over the mastoid bone and relate to her ear symptoms. Will treat with [facility]  as noted above. If symptoms not improved consider CT scan  Frederica KusterStephen M Miller MD   02/22/2016 4:17 PM

## 2016-02-22 NOTE — Patient Instructions (Addendum)
Thank you for allowing us to care for you today. We strive to provide exceptional quality and compassionate care. Please let us know how we are doing and how we can help serve you better by filling out the survey that you receive from [facility].   Sinusitis, Adult Sinusitis is redness, soreness, and inflammation of the paranasal sinuses. Paranasal sinuses are air pockets within the bones of your face. They are located beneath your eyes, in the middle of your forehead, and above your eyes. In healthy paranasal sinuses, mucus is able to drain out, and air is able to circulate through them by way of your nose. However, when your paranasal sinuses are inflamed, mucus and air can become trapped. This can allow bacteria and other germs to grow and cause infection. Sinusitis can develop quickly and last only a short time (acute) or continue over a long period (chronic). Sinusitis that lasts for more than 12 weeks is considered chronic. CAUSES Causes of sinusitis include:  Allergies.  Structural abnormalities, such as displacement of the cartilage that separates your nostrils (deviated septum), which can decrease the air flow through your nose and sinuses and affect sinus drainage.  Functional abnormalities, such as when the small hairs (cilia) that line your sinuses and help remove mucus do not work properly or are not present. SIGNS AND SYMPTOMS Symptoms of acute and chronic sinusitis are the same. The primary symptoms are pain and pressure around the affected sinuses. Other symptoms include:  Upper toothache.  Earache.  Headache.  Bad breath.  Decreased sense of smell and taste.  A cough, which worsens when you are lying flat.  Fatigue.  Fever.  Thick drainage from your nose, which often is green and may contain pus (purulent).  Swelling and warmth over the affected sinuses. DIAGNOSIS Your health care provider will perform a physical exam. During your exam, your health care provider  may perform any of the following to help determine if you have acute sinusitis or chronic sinusitis:  Look in your nose for signs of abnormal growths in your nostrils (nasal polyps).  Tap over the affected sinus to check for signs of infection.  View the inside of your sinuses using an imaging device that has a light attached (endoscope). If your health care provider suspects that you have chronic sinusitis, one or more of the following tests may be recommended:  Allergy tests.  Nasal culture. A sample of mucus is taken from your nose, sent to a lab, and screened for bacteria.  Nasal cytology. A sample of mucus is taken from your nose and examined by your health care provider to determine if your sinusitis is related to an allergy. TREATMENT Most cases of acute sinusitis are related to a viral infection and will resolve on their own within 10 days. Sometimes, medicines are prescribed to help relieve symptoms of both acute and chronic sinusitis. These may include pain medicines, decongestants, nasal steroid sprays, or saline sprays. However, for sinusitis related to a bacterial infection, your health care provider will prescribe antibiotic medicines. These are medicines that will help kill the bacteria causing the infection. Rarely, sinusitis is caused by a fungal infection. In these cases, your health care provider will prescribe antifungal medicine. For some cases of chronic sinusitis, surgery is needed. Generally, these are cases in which sinusitis recurs more than 3 times per year, despite other treatments. HOME CARE INSTRUCTIONS  Drink plenty of water. Water helps thin the mucus so your sinuses can drain more easily.  Use a humidifier.  Inhale steam 3-4 times a day (for example, sit in the bathroom with the shower running).  Apply a warm, moist washcloth to your face 3-4 times a day, or as directed by your health care provider.  Use saline nasal sprays to help moisten and clean your  sinuses.  Take medicines only as directed by your health care provider.  If you were prescribed either an antibiotic or antifungal medicine, finish it all even if you start to feel better. SEEK IMMEDIATE MEDICAL CARE IF:  You have increasing pain or severe headaches.  You have nausea, vomiting, or drowsiness.  You have swelling around your face.  You have vision problems.  You have a stiff neck.  You have difficulty breathing.   This information is not intended to replace advice given to you by your health care provider. Make sure you discuss any questions you have with your health care provider.   Document Released: 12/03/2005 Document Revised: 12/24/2014 Document Reviewed: 12/18/2011 Elsevier Interactive Patient Education [employer]

## 2016-02-22 NOTE — Addendum Note (Signed)
Addended by: Fawn KirkHOLT, CATHY on: 02/22/2016 04:57 PM   Modules accepted: Orders

## 2016-04-16 ENCOUNTER — Other Ambulatory Visit: Payer: Self-pay | Admitting: Obstetrics

## 2016-04-16 DIAGNOSIS — N6322 Unspecified lump in the left breast, upper inner quadrant: Secondary | ICD-10-CM

## 2016-04-16 DIAGNOSIS — Z803 Family history of malignant neoplasm of breast: Secondary | ICD-10-CM

## 2016-04-19 ENCOUNTER — Ambulatory Visit
Admission: RE | Admit: 2016-04-19 | Discharge: 2016-04-19 | Disposition: A | Discharge status: Home / Self Care | Payer: BLUE CROSS/BLUE SHIELD | Source: Ambulatory Visit | Attending: Obstetrics | Admitting: Obstetrics

## 2016-04-19 ENCOUNTER — Other Ambulatory Visit: Payer: Self-pay | Admitting: Obstetrics

## 2016-04-19 DIAGNOSIS — Z803 Family history of malignant neoplasm of breast: Secondary | ICD-10-CM

## 2016-04-19 DIAGNOSIS — N6322 Unspecified lump in the left breast, upper inner quadrant: Secondary | ICD-10-CM

## 2016-10-25 ENCOUNTER — Encounter: Payer: Self-pay | Admitting: Pediatrics

## 2016-10-25 ENCOUNTER — Ambulatory Visit (INDEPENDENT_AMBULATORY_CARE_PROVIDER_SITE_OTHER): Payer: BLUE CROSS/BLUE SHIELD | Admitting: Pediatrics

## 2016-10-25 VITALS — BP 131/84 | HR 68 | Temp 97.9°F | Ht 67.0 in | Wt 244.6 lb

## 2016-10-25 DIAGNOSIS — Z0000 Encounter for general adult medical examination without abnormal findings: Secondary | ICD-10-CM | POA: Diagnosis not present

## 2016-10-25 DIAGNOSIS — Z6838 Body mass index (BMI) 38.0-38.9, adult: Secondary | ICD-10-CM | POA: Diagnosis not present

## 2016-10-25 DIAGNOSIS — E669 Obesity, unspecified: Secondary | ICD-10-CM

## 2016-10-25 LAB — BAYER DCA HB A1C WAIVED: HB A1C (BAYER DCA - WAIVED): 6 % (ref <7.0)

## 2016-10-25 NOTE — Progress Notes (Signed)
  Subjective:   Patient ID: [name], female    DOB: June 06, 1976, 40 y.o.   MRN: [id number] CC: Annual Exam  HPI: [name] is a 40 y.o. female presenting for Annual Exam  This week is 1 yr anniversary of mothers death Has been a hard week  Elevated BMI: Exercising more regularly Walking regularly  Pap smear with Dr Pamala Hurry in 2016 Has pelvic pressure, ongoing for a couple of years Getting pelvic u/s end of the month with Dr. Pamala Hurry  Sleeps fine   Relevant past medical, surgical, family and social history reviewed. Allergies and medications reviewed and updated. History  Smoking Status  . Never Smoker  Smokeless Tobacco  . Never Used   ROS: All other systems negative other than what is in HPI  Objective:    BP 131/84   Pulse 68   Temp 97.9 F (36.6 C) (Oral)   Ht _0  (1.702 m)   Wt 244 lb 9.6 oz (110.9 kg)   BMI 38.31 kg/m   Wt Readings from Last 3 Encounters:  10/25/16 244 lb 9.6 oz (110.9 kg)  02/22/16 255 lb 6.4 oz (115.8 kg)  11/17/15 250 lb (113.4 kg)    Gen: NAD, alert, cooperative with exam, NCAT EYES: EOMI, no conjunctival injection, or no icterus ENT:  TMs pearly gray b/l, OP without erythema LYMPH: no cervical LAD CV: NRRR, normal S1/S2, no murmur Resp: CTABL, no wheezes, normal WOB Abd: +BS, soft, NTND. no guarding or organomegaly Ext: No edema, warm Neuro: Alert and oriented, strength equal b/l UE and LE, coordination grossly normal MSK: normal muscle bulk  Assessment & Plan:  Clova was seen today for annual exam.  Diagnoses and all orders for this visit:  Encounter for preventive health examination -     Lipid panel -     CMP14+EGFR -     Bayer DCA Hb A1c Waived -     TSH  Class 2 obesity with body mass index (BMI) of 38.0 to 38.9 in adult, unspecified obesity type, unspecified whether serious comorbidity present Discussed lifestyle changes, weight down 10 lbs from last visit  Follow up plan: RTC 1 yr, sooner if  needed Assunta Found, MD [place]

## 2016-10-26 LAB — LIPID PANEL
CHOL/HDL RATIO: 3.3 ratio (ref 0.0–4.4)
Cholesterol, Total: 138 mg/dL (ref 100–199)
HDL: 42 mg/dL (ref >39)
LDL Calculated: 82 mg/dL (ref 0–99)
Triglycerides: 70 mg/dL (ref 0–149)
VLDL Cholesterol Cal: 14 mg/dL (ref 5–40)

## 2016-10-26 LAB — CMP14+EGFR
ALT: 36 IU/L — AB (ref 0–32)
AST: 22 IU/L (ref 0–40)
Albumin/Globulin Ratio: 1.8 (ref 1.2–2.2)
Albumin: 4.6 g/dL (ref 3.5–5.5)
Alkaline Phosphatase: 81 IU/L (ref 39–117)
BILIRUBIN TOTAL: 0.6 mg/dL (ref 0.0–1.2)
BUN/Creatinine Ratio: 15 (ref 9–23)
BUN: 11 mg/dL (ref 6–24)
CHLORIDE: 100 mmol/L (ref 96–106)
CO2: 24 mmol/L (ref 18–29)
Calcium: 9.5 mg/dL (ref 8.7–10.2)
Creatinine, Ser: 0.74 mg/dL (ref 0.57–1.00)
GFR calc non Af Amer: 101 mL/min/{1.73_m2} (ref >59)
GFR, EST AFRICAN AMERICAN: 116 mL/min/{1.73_m2} (ref >59)
GLUCOSE: 133 mg/dL — AB (ref 65–99)
Globulin, Total: 2.6 g/dL (ref 1.5–4.5)
Potassium: 4.4 mmol/L (ref 3.5–5.2)
Sodium: 141 mmol/L (ref 134–144)
TOTAL PROTEIN: 7.2 g/dL (ref 6.0–8.5)

## 2016-10-26 LAB — TSH: TSH: 0.956 u[IU]/mL (ref 0.450–4.500)

## 2017-01-04 ENCOUNTER — Encounter: Payer: Self-pay | Admitting: Physician Assistant

## 2017-01-04 ENCOUNTER — Ambulatory Visit (INDEPENDENT_AMBULATORY_CARE_PROVIDER_SITE_OTHER): Payer: BLUE CROSS/BLUE SHIELD | Admitting: Physician Assistant

## 2017-01-04 VITALS — BP 122/87 | HR 87 | Temp 98.1°F | Ht 67.0 in | Wt 242.0 lb

## 2017-01-04 DIAGNOSIS — J09X2 Influenza due to identified novel influenza A virus with other respiratory manifestations: Secondary | ICD-10-CM | POA: Diagnosis not present

## 2017-01-04 DIAGNOSIS — R509 Fever, unspecified: Secondary | ICD-10-CM

## 2017-01-04 LAB — VERITOR FLU A/B WAIVED
INFLUENZA B: NEGATIVE
Influenza A: POSITIVE — AB

## 2017-01-04 MED ORDER — OSELTAMIVIR PHOSPHATE 75 MG PO CAPS: 75.0000 mg | ORAL_CAPSULE | Freq: Two times a day (BID) | ORAL | 0 refills | Status: DC

## 2017-01-04 NOTE — Patient Instructions (Signed)
influenza Influenza, Adult Influenza, more commonly known as "the flu," is a viral infection that primarily affects the respiratory tract. The respiratory tract includes organs that help you breathe, such as the lungs, nose, and throat. The flu causes many common cold symptoms, as well as a high fever and body aches. The flu spreads easily from person to person (is contagious). Getting a flu shot (influenza vaccination) every year is the best way to prevent influenza. What are the causes? Influenza is caused by a virus. You can catch the virus by:  Breathing in droplets from an infected person's cough or sneeze.  Touching something that was recently contaminated with the virus and then touching your mouth, nose, or eyes. What increases the risk? The following factors may make you more likely to get the flu:  Not cleaning your hands frequently with soap and water or alcohol-based hand sanitizer.  Having close contact with many people during cold and flu season.  Touching your mouth, eyes, or nose without washing or sanitizing your hands first.  Not drinking enough fluids or not eating a healthy diet.  Not getting enough sleep or exercise.  Being under a high amount of stress.  Not getting a yearly (annual) flu shot. You may be at a higher risk of complications from the flu, such as a severe lung infection (pneumonia), if you:  Are over the age of 42.  Are pregnant.  Have a weakened disease-fighting system (immune system). You may have a weakened immune system if you:  Have HIV or AIDS.  Are undergoing chemotherapy.  Aretaking medicines that reduce the activity of (suppress) the immune system.  Have a long-term (chronic) illness, such as heart disease, kidney disease, diabetes, or lung disease.  Have a liver disorder.  Are obese.  Have anemia. What are the signs or symptoms? Symptoms of this condition typically last 4-10 days and may  include:  Fever.  Chills.  Headache, body aches, or muscle aches.  Sore throat.  Cough.  Runny or congested nose.  Chest discomfort and cough.  Poor appetite.  Weakness or tiredness (fatigue).  Dizziness.  Nausea or vomiting. How is this diagnosed? This condition may be diagnosed based on your medical history and a physical exam. Your health care provider may do a nose or throat swab test to confirm the diagnosis. How is this treated? If influenza is detected early, you can be treated with antiviral medicine that can reduce the length of your illness and the severity of your symptoms. This medicine may be given by mouth (orally) or through an IV tube that is inserted in one of your veins. The goal of treatment is to relieve symptoms by taking care of yourself at home. This may include taking over-the-counter medicines, drinking plenty of fluids, and adding humidity to the air in your home. In some cases, influenza goes away on its own. Severe influenza or complications from influenza may be treated in a hospital. Follow these instructions at home:  Take over-the-counter and prescription medicines only as told by your health care provider.  Use a cool mist humidifier to add humidity to the air in your home. This can make breathing easier.  Rest as needed.  Drink enough fluid to keep your urine clear or pale yellow.  Cover your mouth and nose when you cough or sneeze.  Wash your hands with soap and water often, especially after you cough or sneeze. If soap and water are not available, use hand sanitizer.  Stay home  from work or school as told by your health care provider. Unless you are visiting your health care provider, try to avoid leaving home until your fever has been gone for 24 hours without the use of medicine.  Keep all follow-up visits as told by your health care provider. This is important. How is this prevented?  Getting an annual flu shot is the best way to  avoid getting the flu. You may get the flu shot in late summer, fall, or winter. Ask your health care provider when you should get your flu shot.  Wash your hands often or use hand sanitizer often.  Avoid contact with people who are sick during cold and flu season.  Eat a healthy diet, drink plenty of fluids, get enough sleep, and exercise regularly. Contact a health care provider if:  You develop new symptoms.  You have:  Chest pain.  Diarrhea.  A fever.  Your cough gets worse.  You produce more mucus.  You feel nauseous or you vomit. Get help right away if:  You develop shortness of breath or difficulty breathing.  Your skin or nails turn a bluish color.  You have severe pain or stiffness in your neck.  You develop a sudden headache or sudden pain in your face or ear.  You cannot stop vomiting. This information is not intended to replace advice given to you by your health care provider. Make sure you discuss any questions you have with your health care provider. Document Released: 11/30/2000 Document Revised: 05/10/2016 Document Reviewed: 09/27/2015 Elsevier Interactive Patient Education  2017 [employer].

## 2017-01-04 NOTE — Progress Notes (Signed)
BP 122/87   Pulse 87   Temp 98.1 F (36.7 C) (Oral)   Ht 5\' 7"  (1.702 m)   Wt 242 lb (109.8 kg)   BMI 37.90 kg/m    Subjective:    Patient ID: [name], female    DOB: September 25, 1976, 41 y.o.   MRN: [id number]  HPI: [name] is a 41 y.o. female presenting on 01/04/2017 for Fever; Cough; and Generalized Body Aches  This patient has had less than 2 days severe fever, chills, myalgias.  Complains of sinus headache and postnasal drainage. There is copious drainage at times. Associated sore throat, decreased appetite and headache.  Has been exposed to influenza.   Relevant past medical, surgical, family and social history reviewed and updated as indicated. Allergies and medications reviewed and updated.  History reviewed. No pertinent past medical history.  Past Surgical History:  Procedure Laterality Date  . CHOLECYSTECTOMY    . FOOT SURGERY    . KIDNEY SURGERY      Review of Systems  Constitutional: Positive for appetite change, chills, fatigue and fever. Negative for activity change.  HENT: Positive for congestion, postnasal drip and sore throat.   Eyes: Negative.   Respiratory: Positive for cough. Negative for wheezing.   Cardiovascular: Negative.  Negative for chest pain, palpitations and leg swelling.  Gastrointestinal: Negative.   Genitourinary: Negative.   Musculoskeletal: Positive for myalgias.  Skin: Negative.   Neurological: Positive for headaches.    Allergies as of 01/04/2017      Reactions   Ivp Dye [iodinated Diagnostic Agents]    Penicillins       Medication List       Accurate as of 01/04/17 12:40 PM. Always use your most recent med list.          fexofenadine 180 MG tablet Commonly known as:  ALLEGRA Take 180 mg by mouth.   oseltamivir 75 MG capsule Commonly known as:  TAMIFLU Take 1 capsule (75 mg total) by mouth 2 (two) times daily.          Objective:    BP 122/87   Pulse 87   Temp 98.1 F (36.7 C) (Oral)   Ht 5\' 7"   (1.702 m)   Wt 242 lb (109.8 kg)   BMI 37.90 kg/m   Allergies  Allergen Reactions  . Ivp Dye [Iodinated Diagnostic Agents]   . Penicillins     Physical Exam  Constitutional: She is oriented to person, place, and time. She appears well-developed and well-nourished. No distress.  HENT:  Head: Normocephalic and atraumatic.  Right Ear: Tympanic membrane normal.  Left Ear: Tympanic membrane normal.  Nose: Mucosal edema and rhinorrhea present. Right sinus exhibits no frontal sinus tenderness. Left sinus exhibits no frontal sinus tenderness.  Mouth/Throat: Posterior oropharyngeal erythema present. No oropharyngeal exudate or tonsillar abscesses.  Eyes: Conjunctivae and EOM are normal. Pupils are equal, round, and reactive to light.  Neck: Normal range of motion.  Cardiovascular: Normal rate, regular rhythm, normal heart sounds, intact distal pulses and normal pulses.   Pulmonary/Chest: Effort normal and breath sounds normal. No respiratory distress.  Abdominal: Soft. Bowel sounds are normal.  Neurological: She is alert and oriented to person, place, and time. She has normal reflexes.  Skin: Skin is warm and dry. No rash noted.  Psychiatric: She has a normal mood and affect. Her behavior is normal. Judgment and thought content normal.  Nursing note and vitals reviewed.       Assessment & Plan:  1. Fever, unspecified fever cause - [facility] Flu A/B Waived - oseltamivir (TAMIFLU) 75 MG capsule; Take 1 capsule (75 mg total) by mouth 2 (two) times daily.  Dispense: 10 capsule; Refill: 0  2. Influenza due to identified novel influenza A virus with other respiratory manifestations - oseltamivir (TAMIFLU) 75 MG capsule; Take 1 capsule (75 mg total) by mouth 2 (two) times daily.  Dispense: 10 capsule; Refill: 0   Continue all other maintenance medications as listed above.  Follow up plan: Return if symptoms worsen or fail to improve.  Orders Placed This Encounter  Procedures  . Ver[facility]  Flu A/B [facility]Waived    Educational handout given for influenza  Remus LofflerAngel S. Marisue Canion PA-C [facility] [address]  [place], [place] [address] [phone]   01/04/2017, 12:40 PM

## 2017-10-21 ENCOUNTER — Ambulatory Visit (INDEPENDENT_AMBULATORY_CARE_PROVIDER_SITE_OTHER): Payer: BLUE CROSS/BLUE SHIELD | Admitting: Nurse Practitioner

## 2017-10-21 ENCOUNTER — Ambulatory Visit (INDEPENDENT_AMBULATORY_CARE_PROVIDER_SITE_OTHER): Payer: BLUE CROSS/BLUE SHIELD

## 2017-10-21 ENCOUNTER — Encounter: Payer: Self-pay | Admitting: Nurse Practitioner

## 2017-10-21 ENCOUNTER — Ambulatory Visit (HOSPITAL_COMMUNITY)
Admission: RE | Admit: 2017-10-21 | Discharge: 2017-10-21 | Disposition: A | Discharge status: Home / Self Care | Payer: BLUE CROSS/BLUE SHIELD | Source: Ambulatory Visit | Attending: Nurse Practitioner | Admitting: Nurse Practitioner

## 2017-10-21 ENCOUNTER — Other Ambulatory Visit: Payer: Self-pay | Admitting: Obstetrics

## 2017-10-21 ENCOUNTER — Other Ambulatory Visit: Payer: Self-pay | Admitting: Nurse Practitioner

## 2017-10-21 VITALS — BP 124/81 | HR 67 | Temp 98.0°F | Ht 67.0 in | Wt 242.0 lb

## 2017-10-21 DIAGNOSIS — M25561 Pain in right knee: Secondary | ICD-10-CM

## 2017-10-21 DIAGNOSIS — M79661 Pain in right lower leg: Secondary | ICD-10-CM | POA: Insufficient documentation

## 2017-10-21 DIAGNOSIS — N6320 Unspecified lump in the left breast, unspecified quadrant: Secondary | ICD-10-CM

## 2017-10-21 MED ORDER — PREDNISONE 10 MG (21) PO TBPK: ORAL_TABLET | ORAL | 0 refills | Status: DC

## 2017-10-21 NOTE — Progress Notes (Signed)
Subjective:    Megan MessickJeannie M [name] is a 41 y.o. female who presents with knee pain involving the right knee. Onset was gradual, starting about 2 weeks ago. Inciting event: none known. Current symptoms include: pain located anterior and posterior. Pain is aggravated by any weight bearing, going up and down stairs, rising after sitting and standing. Patient has had no prior knee problems. Evaluation to date: none. Treatment to date: none.  The following portions of the patient's history were reviewed and updated as appropriate: allergies, current medications, past family history, past medical history, past social history, past surgical history and problem list.   Review of Systems Pertinent items noted in HPI and remainder of comprehensive ROS otherwise negative.   Objective:    There were no vitals taken for this visit. Right knee: positive exam findings: effusion and pain on full flexion and extension and negative exam findings: no erythema, ACL stable, PCL stable, MCL stable, LCL stable, no patellar laxity, no crepitus and FROM  Calf pain on palpation with (+) homan sign  Left knee:  normal and no effusion, full active range of motion, no joint line tenderness, ligamentous structures intact.   X-ray right knee: no fracture, dislocation, swelling or degenerative changes noted    Assessment:    Right knee and calf pain    Plan:    Rest, ice, compression, and elevation (RICE) therapy. Follow up in 1 week. to er if calf pain worsens   Will call with doppler results  Mary-Margaret Daphine DeutscherMartin, FNP

## 2017-10-21 NOTE — Patient Instructions (Signed)
Deep Vein Thrombosis A deep vein thrombosis (DVT) is a blood clot (thrombus) that usually occurs in a deep, larger vein of the lower leg or the pelvis, or in an upper extremity such as the arm. These are dangerous and can lead to serious and even life-threatening complications if the clot travels to the lungs. A DVT can damage the valves in your leg veins so that instead of flowing upward, the blood pools in the lower leg. This is called post-thrombotic syndrome, and it can result in pain, swelling, discoloration, and sores on the leg. What are the causes? A DVT is caused by the formation of a blood clot in your leg, pelvis, or arm. Usually, several things contribute to the formation of blood clots. A clot may develop when:  Your blood flow slows down.  Your vein becomes damaged in some way.  You have a condition that makes your blood clot more easily.  What increases the risk? A DVT is more likely to develop in:  People who are older, especially over 60 years of age.  People who are overweight (obese).  People who sit or lie still for a long time, such as during long-distance travel (over 4 hours), bed rest, hospitalization, or during recovery from certain medical conditions like a stroke.  People who do not engage in much physical activity (sedentary lifestyle).  People who have chronic breathing disorders.  People who have a personal or family history of blood clots or blood clotting disease.  People who have peripheral vascular disease (PVD), diabetes, or some types of cancer.  People who have heart disease, especially if the person had a recent heart attack or has congestive heart failure.  People who have neurological diseases that affect the legs (leg paresis).  People who have had a traumatic injury, such as breaking a hip or leg.  People who have recently had major or lengthy surgery, especially on the hip, knee, or abdomen.  People who have had a central line placed  inside a large vein.  People who take medicines that contain the hormone estrogen. These include birth control pills and hormone replacement therapy.  Pregnancy or during childbirth or the postpartum period.  Long plane flights (over 8 hours).  What are the signs or symptoms?  Symptoms of a DVT can include:  Swelling of your leg or arm, especially if one side is much worse.  Warmth and redness of your leg or arm, especially if one side is much worse.  Pain in your arm or leg. If the clot is in your leg, symptoms may be more noticeable or worse when you stand or walk.  A feeling of pins and needles, if the clot is in the arm.  The symptoms of a DVT that has traveled to the lungs (pulmonary embolism, PE) usually start suddenly and include:  Shortness of breath while active or at rest.  Coughing or coughing up blood or blood-tinged mucus.  Chest pain that is often worse with deep breaths.  Rapid or irregular heartbeat.  Feeling light-headed or dizzy.  Fainting.  Feeling anxious.  Sweating.  There may also be pain and swelling in a leg if that is where the blood clot started. These symptoms may represent a serious problem that is an emergency. Do not wait to see if the symptoms will go away. Get medical help right away. Call your local emergency services (911 in the [place] Do not drive yourself to the hospital. How is this diagnosed? Your health   care provider will take a medical history and perform a physical exam. You may also have other tests, including:  Blood tests to assess the clotting properties of your blood.  Imaging tests, such as CT, ultrasound, MRI, X-ray, and other tests to see if you have clots anywhere in your body.  How is this treated? After a DVT is identified, it can be treated. The type of treatment that you receive depends on many factors, such as the cause of your DVT, your risk for bleeding or developing more clots, and other medical conditions that  you have. Sometimes, a combination of treatments is necessary. Treatment options may be combined and include:  Monitoring the blood clot with ultrasound.  Taking medicines by mouth, such as newer blood thinners (anticoagulants), thrombolytics, or warfarin.  Taking anticoagulant medicine by injection or through an IV tube.  Wearing compression stockings or using different types ofdevices.  Surgery (rare) to remove the blood clot or to place a filter in your abdomen to stop the blood clot from traveling to your lungs.  Treatments for a DVT are often divided into immediate treatment and long-term treatment (up to 3 months after DVT). You can work with your health care provider to choose the treatment program that is best for you. Follow these instructions at home: If you are taking a newer oral anticoagulant:  Take the medicine every single day at the same time each day.  Understand what foods and drugs interact with this medicine.  Understand that there are no regular blood tests required when using this medicine.  Understand the side effects of this medicine, including excessive bruising or bleeding. Ask your health care provider or pharmacist about other possible side effects. If you are taking warfarin:  Understand how to take warfarin and know which foods can affect how warfarin works in your body.  Understand that it is dangerous to take too much or too little warfarin. Too much warfarin increases the risk of bleeding. Too little warfarin continues to allow the risk for blood clots.  Follow your PT and INR blood testing schedule. The PT and INR results allow your health care provider to adjust your dose of warfarin. It is very important that you have your PT and INR tested as often as told by your health care provider.  Avoid major changes in your diet, or tell your health care provider before you change your diet. Arrange a visit with a registered dietitian to answer your  questions. Many foods, especially foods that are high in vitamin K, can interfere with warfarin and affect the PT and INR results. Eat a consistent amount of foods that are high in vitamin K, such as: ? Spinach, kale, broccoli, cabbage, collard greens, turnip greens, Brussels sprouts, peas, cauliflower, seaweed, and parsley. ? Beef liver and pork liver. ? Green tea. ? Soybean oil.  Tell your health care provider about any and all medicines, vitamins, and supplements that you take, including aspirin and other over-the-counter anti-inflammatory medicines. Be especially cautious with aspirin and anti-inflammatory medicines. Do not take those before you ask your health care provider if it is safe to do so. This is important because many medicines can interfere with warfarin and affect the PT and INR results.  Do not start or stop taking any over-the-counter or prescription medicine unless your health care provider or pharmacist tells you to do so. If you take warfarin, you will also need to do these things:  Hold pressure over cuts for longer than   usual.  Tell your dentist and other health care providers that you are taking warfarin before you have any procedures in which bleeding may occur.  Avoid alcohol or drink very small amounts. Tell your health care provider if you change your alcohol intake.  Do not use tobacco products, including cigarettes, chewing tobacco, and e-cigarettes. If you need help quitting, ask your health care provider.  Avoid contact sports.  General instructions  Take over-the-counter and prescription medicines only as told by your health care provider. Anticoagulant medicines can have side effects, including easy bruising and difficulty stopping bleeding. If you are prescribed an anticoagulant, you will also need to do these things: ? Hold pressure over cuts for longer than usual. ? Tell your dentist and other health care providers that you are taking anticoagulants  before you have any procedures in which bleeding may occur. ? Avoid contact sports.  Wear a medical alert bracelet or carry a medical alert card that says you have had a PE.  Ask your health care provider how soon you can go back to your normal activities. Stay active to prevent new blood clots from forming.  Make sure to exercise while traveling or when you have been sitting or standing for a long period of time. It is very important to exercise. Exercise your legs by walking or by tightening and relaxing your leg muscles often. Take frequent walks.  Wear compression stockings as told by your health care provider to help prevent more blood clots from forming.  Do not use tobacco products, including cigarettes, chewing tobacco, and e-cigarettes. If you need help quitting, ask your health care provider.  Keep all follow-up appointments with your health care provider. This is important. How is this prevented? Take these actions to decrease your risk of developing another DVT:  Exercise regularly. For at least 30 minutes every day, engage in: ? Activity that involves moving your arms and legs. ? Activity that encourages good blood flow through your body by increasing your heart rate.  Exercise your arms and legs every hour during long-distance travel (over 4 hours). Drink plenty of water and avoid drinking alcohol while traveling.  Avoid sitting or lying in bed for long periods of time without moving your legs.  Maintain a weight that is appropriate for your height. Ask your health care provider what weight is healthy for you.  If you are a woman who is over 35 years of age, avoid unnecessary use of medicines that contain estrogen. These include birth control pills.  Do not smoke, especially if you take estrogen medicines. If you need help quitting, ask your health care provider.  If you are hospitalized, prevention measures may include:  Early walking after surgery, as soon as your  health care provider says that it is safe.  Receiving anticoagulants to prevent blood clots.If you cannot take anticoagulants, other options may be available, such as wearing compression stockings or using different types of devices.  Get help right away if:  You have new or increased pain, swelling, or redness in an arm or leg.  You have numbness or tingling in an arm or leg.  You have shortness of breath while active or at rest.  You have chest pain.  You have a rapid or irregular heartbeat.  You feel light-headed or dizzy.  You cough up blood.  You notice blood in your vomit, bowel movement, or urine. These symptoms may represent a serious problem that is an emergency. Do not wait to see   if the symptoms will go away. Get medical help right away. Call your local emergency services (911 in the [place] Do not drive yourself to the hospital. This information is not intended to replace advice given to you by your health care provider. Make sure you discuss any questions you have with your health care provider. Document Released: 12/03/2005 Document Revised: 05/10/2016 Document Reviewed: 03/30/2015 Elsevier Interactive Patient Education  2017 [employer]  

## 2017-10-21 NOTE — Addendum Note (Signed)
Addended by: Bennie PieriniMARTIN, MARY-MARGARET on: 10/21/2017 01:03 PM   Modules accepted: Orders

## 2017-10-23 ENCOUNTER — Ambulatory Visit
Admission: RE | Admit: 2017-10-23 | Discharge: 2017-10-23 | Disposition: A | Discharge status: Home / Self Care | Payer: BLUE CROSS/BLUE SHIELD | Source: Ambulatory Visit | Attending: Obstetrics | Admitting: Obstetrics

## 2017-10-23 DIAGNOSIS — N6320 Unspecified lump in the left breast, unspecified quadrant: Secondary | ICD-10-CM

## 2017-11-18 ENCOUNTER — Ambulatory Visit (INDEPENDENT_AMBULATORY_CARE_PROVIDER_SITE_OTHER): Payer: BLUE CROSS/BLUE SHIELD | Admitting: Pediatrics

## 2017-11-18 ENCOUNTER — Encounter: Payer: Self-pay | Admitting: Pediatrics

## 2017-11-18 VITALS — BP 130/82 | HR 71 | Temp 97.9°F | Ht 67.0 in | Wt 242.0 lb

## 2017-11-18 DIAGNOSIS — Z6837 Body mass index (BMI) 37.0-37.9, adult: Secondary | ICD-10-CM | POA: Diagnosis not present

## 2017-11-18 DIAGNOSIS — Z0000 Encounter for general adult medical examination without abnormal findings: Secondary | ICD-10-CM

## 2017-11-18 DIAGNOSIS — E669 Obesity, unspecified: Secondary | ICD-10-CM

## 2017-11-18 LAB — BAYER DCA HB A1C WAIVED: HB A1C (BAYER DCA - WAIVED): 6.4 % (ref <7.0)

## 2017-11-18 NOTE — Progress Notes (Signed)
  Subjective:   Patient ID: [name], female    DOB: 09-Nov-1976, 41 y.o.   MRN: [id number] CC: Annual Exam  HPI: [name] is a 41 y.o. female presenting for Annual Exam  Working at [employer] On feet all day, works 12 h shifts several days a week Work is very active Not regularly exercising outside of work Has 2 teenagers at home  Mood has been fine Says she overeats often when she is feeling stressed  Had mammogram last year, repeat last month for follow-up Pap smear--follows with gyn, sees Dr Valentino Saxon  Fasting today  Relevant past medical, surgical, family and social history reviewed. Allergies and medications reviewed and updated. Social History   Tobacco Use  Smoking Status Never Smoker  Smokeless Tobacco Never Used   ROS: All systems negative other than what is in the HPI  Objective:    BP 130/82   Pulse 71   Temp 97.9 F (36.6 C) (Oral)   Ht _0  (1.702 m)   Wt 242 lb (109.8 kg)   LMP 10/23/2017   BMI 37.90 kg/m   Wt Readings from Last 3 Encounters:  11/18/17 242 lb (109.8 kg)  10/21/17 242 lb (109.8 kg)  01/04/17 242 lb (109.8 kg)    Gen: NAD, alert, cooperative with exam, NCAT EYES: EOMI, no conjunctival injection, or no icterus ENT:  TMs pearly gray b/l, OP without erythema LYMPH: no cervical LAD CV: NRRR, normal S1/S2, no murmur, distal pulses 2+ b/l Resp: CTABL, no wheezes, normal WOB Abd: +BS, soft, NTND. no guarding or organomegaly Ext: No edema, warm Neuro: Alert and oriented, strength equal b/l UE and LE, coordination grossly normal MSK: normal muscle bulk  Assessment & Plan:  [name] was seen today for annual exam.  Diagnoses and all orders for this visit:  Encounter for preventive health examination Gets Pap smears and mammograms through gynecology, up-to-date -     CMP14+EGFR -     Lipid panel -     Bayer DCA Hb A1c Waived  Class 2 obesity without serious comorbidity with body mass index (BMI) of 37.0  to 37.9 in adult, unspecified obesity type Discussed regular activity, lifestyle changes, increase fruit and vegetable intake, minimizing fast food Goal 5-10 pound weight loss over the next few months  Follow up plan: 1 year or sooner if needed Assunta Found, MD [place]

## 2017-11-19 LAB — LIPID PANEL
Chol/HDL Ratio: 3.2 ratio (ref 0.0–4.4)
Cholesterol, Total: 114 mg/dL (ref 100–199)
HDL: 36 mg/dL — AB (ref >39)
LDL Calculated: 67 mg/dL (ref 0–99)
TRIGLYCERIDES: 56 mg/dL (ref 0–149)
VLDL CHOLESTEROL CAL: 11 mg/dL (ref 5–40)

## 2017-11-19 LAB — CMP14+EGFR
A/G RATIO: 1.6 (ref 1.2–2.2)
ALT: 20 IU/L (ref 0–32)
AST: 17 IU/L (ref 0–40)
Albumin: 4.2 g/dL (ref 3.5–5.5)
Alkaline Phosphatase: 73 IU/L (ref 39–117)
BUN/Creatinine Ratio: 11 (ref 9–23)
BUN: 6 mg/dL (ref 6–24)
Bilirubin Total: 0.5 mg/dL (ref 0.0–1.2)
CALCIUM: 9.1 mg/dL (ref 8.7–10.2)
CO2: 25 mmol/L (ref 20–29)
Chloride: 102 mmol/L (ref 96–106)
Creatinine, Ser: 0.55 mg/dL — ABNORMAL LOW (ref 0.57–1.00)
GFR, EST AFRICAN AMERICAN: 134 mL/min/{1.73_m2} (ref >59)
GFR, EST NON AFRICAN AMERICAN: 116 mL/min/{1.73_m2} (ref >59)
Globulin, Total: 2.6 g/dL (ref 1.5–4.5)
Glucose: 119 mg/dL — ABNORMAL HIGH (ref 65–99)
POTASSIUM: 4.1 mmol/L (ref 3.5–5.2)
Sodium: 141 mmol/L (ref 134–144)
TOTAL PROTEIN: 6.8 g/dL (ref 6.0–8.5)

## 2017-12-03 ENCOUNTER — Encounter: Payer: Self-pay | Admitting: Family

## 2017-12-03 ENCOUNTER — Ambulatory Visit (INDEPENDENT_AMBULATORY_CARE_PROVIDER_SITE_OTHER): Payer: BLUE CROSS/BLUE SHIELD | Admitting: Family

## 2017-12-03 VITALS — BP 122/70 | HR 60 | Temp 99.7°F | Ht 67.0 in | Wt 241.0 lb

## 2017-12-03 DIAGNOSIS — H02826 Cysts of left eye, unspecified eyelid: Secondary | ICD-10-CM | POA: Diagnosis not present

## 2017-12-03 MED ORDER — BACITRACIN-POLYMYXIN B 500-10000 UNIT/GM OP OINT: 1.0000 "application " | TOPICAL_OINTMENT | Freq: Four times a day (QID) | OPHTHALMIC | 0 refills | Status: DC

## 2017-12-03 NOTE — Patient Instructions (Signed)

## 2017-12-03 NOTE — Progress Notes (Signed)
   Subjective:    Patient ID: Berniece SalinesJeannie Marie Wilensky, female    DOB: Jul 15, 1976, 41 y.o.   MRN: [id number]  HPI Pt presents to the office today with a stye on her left upper eye that she noticed two weeks ago. Pt states the soreness is intermittent 2-3 out 10. Denies any discharge or vision changes.     Review of Systems  Constitutional: Negative for chills.  Eyes: Positive for pain.  All other systems reviewed and are negative.      Objective:   Physical Exam  Constitutional: She is oriented to person, place, and time. She appears well-developed and well-nourished. No distress.  HENT:  Head: Normocephalic and atraumatic.  Right Ear: External ear normal.  Left Ear: External ear normal.  Mouth/Throat: Oropharynx is clear and moist.  Left upper eyelid cyst present with mild tenderness  Eyes: Pupils are equal, round, and reactive to light. Left eye exhibits no discharge and no exudate.    Neck: Normal range of motion. Neck supple. No thyromegaly present.  Cardiovascular: Normal rate, regular rhythm, normal heart sounds and intact distal pulses.  No murmur heard. Pulmonary/Chest: Effort normal and breath sounds normal. No respiratory distress. She has no wheezes.  Abdominal: Soft. Bowel sounds are normal. She exhibits no distension. There is no tenderness.  Musculoskeletal: Normal range of motion. She exhibits no edema or tenderness.  Neurological: She is alert and oriented to person, place, and time. She has normal reflexes. No cranial nerve deficit.  Skin: Skin is warm and dry.  Psychiatric: She has a normal mood and affect. Her behavior is normal. Judgment and thought content normal.  Vitals reviewed.     BP 122/70   Pulse 60   Temp 99.7 F (37.6 C) (Oral)   Ht 5\' 7"  (1.702 m)   Wt 241 lb (109.3 kg)   BMI 37.75 kg/m      Assessment & Plan:  1. Epidermoid cyst of eyelid, left Do not pick or squeeze Good hand hygiene discussed RTO prn  - bacitracin-polymyxin b  (POLYSPORIN) ophthalmic ointment; Place 1 application into the left eye 4 (four) times daily.  Dispense: 3.5 g; Refill: 0 - Ambulatory referral to Ophthalmology    Jannifer Rodneyhristy Elyas Villamor, FNP

## 2018-09-10 ENCOUNTER — Encounter (INDEPENDENT_AMBULATORY_CARE_PROVIDER_SITE_OTHER): Payer: Self-pay

## 2018-09-10 ENCOUNTER — Ambulatory Visit (INDEPENDENT_AMBULATORY_CARE_PROVIDER_SITE_OTHER): Payer: BLUE CROSS/BLUE SHIELD | Admitting: Family Medicine

## 2018-09-10 ENCOUNTER — Encounter: Payer: Self-pay | Admitting: Family Medicine

## 2018-09-10 VITALS — BP 134/79 | HR 70 | Temp 97.1°F | Ht 67.0 in | Wt 255.0 lb

## 2018-09-10 DIAGNOSIS — Z23 Encounter for immunization: Secondary | ICD-10-CM | POA: Diagnosis not present

## 2018-09-10 DIAGNOSIS — L247 Irritant contact dermatitis due to plants, except food: Secondary | ICD-10-CM | POA: Diagnosis not present

## 2018-09-10 MED ORDER — METHYLPREDNISOLONE ACETATE 80 MG/ML IJ SUSP
80.0000 mg | Freq: Once | INTRAMUSCULAR | Status: AC
  Administered 2018-09-10: 80 mg via INTRAMUSCULAR

## 2018-09-10 NOTE — Patient Instructions (Signed)
You are treated with a dose of Depo-Medrol intramuscularly today.  This is a steroid.  You may continue the calamine lotion and topical hydrocortisone cream if needed.  Do not apply hydrocortisone cream to the groin, underarms or face.  If the rash underneath the breast and in the groin area does not improve, I would recommend that you return for reevaluation.  Sometimes yeast can present like this.

## 2018-09-10 NOTE — Progress Notes (Signed)
Subjective: CC: Poison ivy/poison oak PCP: Johna Sheriff, MD [facility] [name] is a 42 y.o. female presenting to clinic today for:  1.  Poison ivy/poison oak.   Patient notes that she developed a rash on the inside of her left upper extremity, underneath her breast, on her right thigh and at the base of the left side of her neck after mowing on Sunday.  She describes the rashes diffusely itchy.  She denies any associated shortness of breath, wheeze or throat irritation.  She is been applying calamine lotion and intermittently using hydrocortisone cream with some relief of symptoms.  She notes that it seems to be "spreading" and is not especially controlled by home therapies so she wanted to have this checked out.   ROS: Per HPI  Allergies  Allergen Reactions  . Ivp Dye [Iodinated Diagnostic Agents]   . Penicillins    No past medical history on file.  Current Outpatient Medications:  .  fexofenadine (ALLEGRA) 180 MG tablet, Take 180 mg by mouth., Disp: , Rfl:  Social History   Socioeconomic History  . Marital status: Married    Spouse name: Not on file  . Number of children: Not on file  . Years of education: Not on file  . Highest education level: Not on file  Occupational History  . Not on file  Social Needs  . Financial resource strain: Not on file  . Food insecurity:    Worry: Not on file    Inability: Not on file  . Transportation needs:    Medical: Not on file    Non-medical: Not on file  Tobacco Use  . Smoking status: Never Smoker  . Smokeless tobacco: Never Used  Substance and Sexual Activity  . Alcohol use: No  . Drug use: No  . Sexual activity: Not on file  Lifestyle  . Physical activity:    Days per week: Not on file    Minutes per session: Not on file  . Stress: Not on file  Relationships  . Social connections:    Talks on phone: Not on file    Gets together: Not on file    Attends religious service: Not on file    Active member of club  or organization: Not on file    Attends meetings of clubs or organizations: Not on file    Relationship status: Not on file  . Intimate partner violence:    Fear of current or ex partner: Not on file    Emotionally abused: Not on file    Physically abused: Not on file    Forced sexual activity: Not on file  Other Topics Concern  . Not on file  Social History Narrative  . Not on file   Family History  Problem Relation Age of Onset  . Breast cancer Mother     Objective: Office vital signs reviewed. BP 134/79   Pulse 70   Temp (!) 97.1 F (36.2 C) (Oral)   Ht 5\' 7"  (1.702 m)   Wt 255 lb (115.7 kg)   BMI 39.94 kg/m   Physical Examination:  General: Awake, alert, obese, No acute distress Pulm: Normal work of breathing on room air.  No wheeze Skin: Excoriated, blanching, erythematous maculopapular rash noted on the flexural surface of the left elbow.  There are similar blanching, erythematous maculopapular lesions underneath bilateral breast and along the abdomen.  She has similar but less severe appearing lesions along the left side of the neck.  No  appreciable exudates, purulence or induration.  Assessment/ Plan: 42 y.o. female   1. Irritant contact dermatitis due to plants, except food Appears to be a contact dermatitis likely secondary to plant.  We did discuss that the areas underneath her breast may also be consistent with fungal rash.  If symptoms do not improve over the next couple of weeks, I did recommend that she return for reevaluation.  She was given a dose of Depo-Medrol intramuscularly here today.  She may continue topical hydrocortisone, avoiding the face, axilla and groin and use calamine lotion if needed.  Follow-up PRN. - methylPREDNISolone acetate (DEPO-MEDROL) injection 80 mg  Meds ordered this encounter  Medications  . methylPREDNISolone acetate (DEPO-MEDROL) injection 80 mg     Raliegh Ip, DO [facility] [phone]

## 2018-09-16 ENCOUNTER — Encounter: Payer: Self-pay | Admitting: Nurse Practitioner

## 2018-09-16 ENCOUNTER — Ambulatory Visit (INDEPENDENT_AMBULATORY_CARE_PROVIDER_SITE_OTHER): Payer: BLUE CROSS/BLUE SHIELD | Admitting: Nurse Practitioner

## 2018-09-16 VITALS — BP 132/89 | HR 78 | Temp 98.5°F | Ht 68.0 in | Wt 248.0 lb

## 2018-09-16 DIAGNOSIS — L247 Irritant contact dermatitis due to plants, except food: Secondary | ICD-10-CM | POA: Diagnosis not present

## 2018-09-16 DIAGNOSIS — L509 Urticaria, unspecified: Secondary | ICD-10-CM

## 2018-09-16 MED ORDER — PREDNISONE 20 MG PO TABS: ORAL_TABLET | ORAL | 0 refills | Status: DC

## 2018-09-16 MED ORDER — METHYLPREDNISOLONE ACETATE 80 MG/ML IJ SUSP
80.0000 mg | Freq: Once | INTRAMUSCULAR | Status: AC
  Administered 2018-09-16: 80 mg via INTRAMUSCULAR

## 2018-09-16 NOTE — Progress Notes (Signed)
   Subjective:    Patient ID: [name], female    DOB: January 31, 1976, 42 y.o.   MRN: [id number]   Chief Complaint: Poison Oak   HPI Patient comes in today with contact dermatitis from poison ivy exposure.she was seen in office on 09/10/18 and was given a depomedrol injection. Rash is still on right arm and itching. It has clear up everyu where else.   Review of Systems  Constitutional: Negative.   HENT: Negative.   Respiratory: Negative.   Cardiovascular: Negative.   Gastrointestinal: Negative.   Skin: Positive for rash.  Psychiatric/Behavioral: Negative.   All other systems reviewed and are negative.      Objective:   Physical Exam  Constitutional: She is oriented to person, place, and time. She appears well-developed and well-nourished. No distress.  Cardiovascular: Normal rate and regular rhythm.  Pulmonary/Chest: Effort normal.  Neurological: She is alert and oriented to person, place, and time.  Skin: Skin is warm.  Small vesiculr lesions on right forearm and whelp looking rash on abdomen  Psychiatric: She has a normal mood and affect. Her behavior is normal.       BP 132/89   Pulse 78   Temp 98.5 F (36.9 C) (Oral)   Ht 5\' 8"  (1.727 m)   Wt 248 lb (112.5 kg)   BMI 37.71 kg/m        Assessment & Plan:  [name] in today with chief complaint of Poison Oak   1. Irritant contact dermatitis due to plants, except food Avoid scratching Cool compresses Benadryl when itching - predniSONE (DELTASONE) 20 MG tablet; 2 po at sametime daily for 5 days- start tomorrow  Dispense: 10 tablet; Refill: 0  2. Urticaria Avoid scratching Cool compreses - methylPREDNISolone acetate (DEPO-MEDROL) injection 80 mg  RTO prn  Mary-Margaret Daphine Deutscher, FNP

## 2018-09-16 NOTE — Patient Instructions (Signed)
Hives Hives (urticaria) are itchy, red, swollen areas on your skin. Hives can show up on any part of your body, and they can vary in size. They can be as small as the tip of a pen or much larger. Hives often fade within 24 hours (acute hives). In other cases, new hives show up after old ones fade. This can continue for many days or weeks (chronic hives). Hives are caused by your body's reaction to an irritant or to something that you are allergic to (trigger). You can get hives right after being around a trigger or hours later. Hives do not spread from person to person (are not contagious). Hives may get worse if you scratch them, if you exercise, or if you have worries (emotional stress). Follow these instructions at home: Medicines  Take or apply over-the-counter and prescription medicines only as told by your doctor.  If you were prescribed an antibiotic medicine, use it as told by your doctor. Do not stop taking the antibiotic even if you start to feel better. Skin Care  Apply cool, wet cloths (cool compresses) to the itchy, red, swollen areas.  Do not scratch your skin. Do not rub your skin. General instructions  Do not take hot showers or baths. This can make itching worse.  Do not wear tight clothes.  Use sunscreen and wear clothing that covers your skin when you are outside.  Avoid any triggers that cause your hives. Keep a journal to help you keep track of what causes your hives. Write down: ? What medicines you take. ? What you eat and drink. ? What products you use on your skin.  Keep all follow-up visits as told by your doctor. This is important. Contact a doctor if:  Your symptoms are not better with medicine.  Your joints are painful or swollen. Get help right away if:  You have a fever.  You have belly pain.  Your tongue or lips are swollen.  Your eyelids are swollen.  Your chest or throat feels tight.  You have trouble breathing or swallowing. These  symptoms may be an emergency. Do not wait to see if the symptoms will go away. Get medical help right away. Call your local emergency services (911 in the [place] Do not drive yourself to the hospital. This information is not intended to replace advice given to you by your health care provider. Make sure you discuss any questions you have with your health care provider. Document Released: 09/11/2008 Document Revised: 05/10/2016 Document Reviewed: 09/21/2015 Elsevier Interactive Patient Education  2018 Elsevier Inc.  

## 2019-01-28 ENCOUNTER — Ambulatory Visit (INDEPENDENT_AMBULATORY_CARE_PROVIDER_SITE_OTHER): Payer: BLUE CROSS/BLUE SHIELD | Admitting: Family Medicine

## 2019-01-28 ENCOUNTER — Encounter: Payer: Self-pay | Admitting: Family Medicine

## 2019-01-28 VITALS — BP 128/87 | HR 80 | Temp 98.3°F | Ht 68.0 in | Wt 247.4 lb

## 2019-01-28 DIAGNOSIS — R1013 Epigastric pain: Secondary | ICD-10-CM

## 2019-01-28 DIAGNOSIS — Z1321 Encounter for screening for nutritional disorder: Secondary | ICD-10-CM | POA: Diagnosis not present

## 2019-01-28 MED ORDER — PANTOPRAZOLE SODIUM 40 MG PO TBEC: 40.0000 mg | DELAYED_RELEASE_TABLET | Freq: Every day | ORAL | 11 refills | Status: DC

## 2019-01-28 NOTE — Progress Notes (Signed)
 Subjective:  Patient ID: [name], female    DOB: 07/14/1976  Age: 42 y.o. MRN: [id number]  CC: Abdominal Pain (pt here today c/o epigastric pains for the last 6 months )   HPI [name] presents for concerns regarding 6 months of increasing epigastric pain.  It is also located in the right upper quadrant.  She describes it as a dull intermittent pain sometimes feels like a bad hunger pain.  It has a burning sensation.  When she eats she feels pressure after the meal.  She does have a history of reflux for which she occasionally takes omeprazole over-the-counter.  She takes it with Tums and she mixes it with food at times.  Patient house who has noted a bit of a red tint to her stool and dark black color at times.  She has had a cholecystectomy.  Depression screen PHQ 2/9 01/28/2019 09/10/2018 12/03/2017  Decreased Interest 0 0 0  Down, Depressed, Hopeless 0 0 0  PHQ - 2 Score 0 0 0    History [name] has a past medical history of GERD (gastroesophageal reflux disease).   She has a past surgical history that includes Kidney surgery; Cholecystectomy; and Foot surgery.   Her family history includes Breast cancer in her mother.She reports that she has never smoked. She has never used smokeless tobacco. She reports that she does not drink alcohol or use drugs.    ROS Review of Systems  Constitutional: Negative.   HENT: Negative for congestion.   Eyes: Negative for visual disturbance.  Respiratory: Negative for shortness of breath.   Cardiovascular: Negative for chest pain.  Gastrointestinal: Positive for abdominal pain. Negative for constipation, diarrhea, nausea and vomiting.  Genitourinary: Negative for difficulty urinating.  Musculoskeletal: Negative for arthralgias and myalgias.  Neurological: Negative for headaches.  Psychiatric/Behavioral: Negative for sleep disturbance.    Objective:  BP 128/87   Pulse 80   Temp 98.3 F (36.8 C) (Oral)   Ht 5' 8"  (1.727 m)   Wt 247 lb 6 oz (112.2 kg)   BMI 37.61 kg/m   BP Readings from Last 3 Encounters:  01/28/19 128/87  09/16/18 132/89  09/10/18 134/79    Wt Readings from Last 3 Encounters:  01/28/19 247 lb 6 oz (112.2 kg)  09/16/18 248 lb (112.5 kg)  09/10/18 255 lb (115.7 kg)     Physical Exam Constitutional:      Appearance: She is well-developed.  HENT:     Head: Normocephalic and atraumatic.  Cardiovascular:     Rate and Rhythm: Normal rate and regular rhythm.     Heart sounds: No murmur.  Pulmonary:     Effort: Pulmonary effort is normal.     Breath sounds: Normal breath sounds.  Abdominal:     General: Bowel sounds are normal.     Palpations: Abdomen is soft. There is no mass.     Tenderness: There is abdominal tenderness in the right upper quadrant and epigastric area. There is guarding. There is no right CVA tenderness, left CVA tenderness or rebound. Positive signs include Murphy's sign.  Skin:    General: Skin is warm and dry.  Neurological:     Mental Status: She is alert and oriented to person, place, and time.  Psychiatric:        Behavior: Behavior normal.       Assessment & Plan:   [name] was seen today for abdominal pain.  Diagnoses and all orders for this visit:    Epigastric pain -     CBC with Differential/Platelet -     CMP14+EGFR -     Ambulatory referral to Gastroenterology  Encounter for vitamin deficiency screening -     Vitamin B12  Other orders -     pantoprazole (PROTONIX) 40 MG tablet; Take 1 tablet (40 mg total) by mouth daily. For stomach       I have discontinued [name]'s predniSONE. I am also having her start on pantoprazole. Additionally, I am having her maintain her fexofenadine.  Allergies as of 01/28/2019      Reactions   Ivp Dye [iodinated Diagnostic Agents]    Penicillins       Medication List       Accurate as of January 28, 2019  5:59 PM. Always use your most recent med list.          fexofenadine 180 MG tablet Commonly known as:  ALLEGRA Take 180 mg by mouth.   pantoprazole 40 MG tablet Commonly known as:  PROTONIX Take 1 tablet (40 mg total) by mouth daily. For stomach        Follow-up: Return in about 6 weeks (around 03/11/2019).  Warren Stacks, M.D. 

## 2019-01-29 ENCOUNTER — Encounter: Payer: Self-pay | Admitting: Gastroenterology

## 2019-01-29 LAB — CMP14+EGFR
ALT: 29 IU/L (ref 0–32)
AST: 22 IU/L (ref 0–40)
Albumin/Globulin Ratio: 1.7 (ref 1.2–2.2)
Albumin: 4.2 g/dL (ref 3.8–4.8)
Alkaline Phosphatase: 89 IU/L (ref 39–117)
BUN/Creatinine Ratio: 12 (ref 9–23)
BUN: 8 mg/dL (ref 6–24)
Bilirubin Total: 0.4 mg/dL (ref 0.0–1.2)
CALCIUM: 9.2 mg/dL (ref 8.7–10.2)
CHLORIDE: 101 mmol/L (ref 96–106)
CO2: 21 mmol/L (ref 20–29)
Creatinine, Ser: 0.67 mg/dL (ref 0.57–1.00)
GFR, EST AFRICAN AMERICAN: 125 mL/min/{1.73_m2} (ref >59)
GFR, EST NON AFRICAN AMERICAN: 108 mL/min/{1.73_m2} (ref >59)
GLUCOSE: 196 mg/dL — AB (ref 65–99)
Globulin, Total: 2.5 g/dL (ref 1.5–4.5)
Potassium: 4.1 mmol/L (ref 3.5–5.2)
Sodium: 137 mmol/L (ref 134–144)
TOTAL PROTEIN: 6.7 g/dL (ref 6.0–8.5)

## 2019-01-29 LAB — CBC WITH DIFFERENTIAL/PLATELET
BASOS ABS: 0 10*3/uL (ref 0.0–0.2)
Basos: 1 %
EOS (ABSOLUTE): 0.1 10*3/uL (ref 0.0–0.4)
Eos: 2 %
Hematocrit: 41.8 % (ref 34.0–46.6)
Hemoglobin: 14.2 g/dL (ref 11.1–15.9)
IMMATURE GRANULOCYTES: 1 %
Immature Grans (Abs): 0.1 10*3/uL (ref 0.0–0.1)
LYMPHS: 27 %
Lymphocytes Absolute: 1.7 10*3/uL (ref 0.7–3.1)
MCH: 30.1 pg (ref 26.6–33.0)
MCHC: 34 g/dL (ref 31.5–35.7)
MCV: 89 fL (ref 79–97)
Monocytes Absolute: 0.6 10*3/uL (ref 0.1–0.9)
Monocytes: 9 %
NEUTROS PCT: 60 %
Neutrophils Absolute: 3.8 10*3/uL (ref 1.4–7.0)
Platelets: 204 10*3/uL (ref 150–450)
RBC: 4.71 x10E6/uL (ref 3.77–5.28)
RDW: 11.7 % (ref 11.7–15.4)
WBC: 6.3 10*3/uL (ref 3.4–10.8)

## 2019-01-29 LAB — VITAMIN B12: Vitamin B-12: 447 pg/mL (ref 232–1245)

## 2019-02-02 LAB — SPECIMEN STATUS REPORT

## 2019-02-02 LAB — HGB A1C W/O EAG: Hgb A1c MFr Bld: 6.8 % — ABNORMAL HIGH (ref 4.8–5.6)

## 2019-02-19 ENCOUNTER — Ambulatory Visit (INDEPENDENT_AMBULATORY_CARE_PROVIDER_SITE_OTHER): Payer: BLUE CROSS/BLUE SHIELD | Admitting: Gastroenterology

## 2019-02-19 ENCOUNTER — Encounter: Payer: Self-pay | Admitting: Gastroenterology

## 2019-02-19 VITALS — BP 126/82 | HR 68 | Ht 67.0 in | Wt 246.2 lb

## 2019-02-19 DIAGNOSIS — R140 Abdominal distension (gaseous): Secondary | ICD-10-CM | POA: Diagnosis not present

## 2019-02-19 DIAGNOSIS — K5909 Other constipation: Secondary | ICD-10-CM

## 2019-02-19 DIAGNOSIS — K625 Hemorrhage of anus and rectum: Secondary | ICD-10-CM | POA: Diagnosis not present

## 2019-02-19 DIAGNOSIS — R1010 Upper abdominal pain, unspecified: Secondary | ICD-10-CM | POA: Diagnosis not present

## 2019-02-19 NOTE — Patient Instructions (Addendum)
If you are age 44 or older, your body mass index should be between 23-30. Your Body mass index is 38.56 kg/m. If this is out of the aforementioned range listed, please consider follow up with your Primary Care Provider.  If you are age 37 or younger, your body mass index should be between 19-25. Your Body mass index is 38.56 kg/m. If this is out of the aformentioned range listed, please consider follow up with your Primary Care Provider.   You have been scheduled for an endoscopy and colonoscopy. Please follow the written instructions given to you at your visit today. Please pick up your prep supplies at the pharmacy within the next 1-3 days. If you use inhalers (even only as needed), please bring them with you on the day of your procedure. Your physician has requested that you go to www.startemmi.com and enter the access code given to you at your visit today. This web site gives a general overview about your procedure. However, you should still follow specific instructions given to you by our office regarding your preparation for the procedure.  Start Miralax 1/2 capful to 1 whole capful daily.   It was a pleasure to see you today!  Dr. Myrtie Neither

## 2019-02-19 NOTE — Progress Notes (Signed)
[facility] Consult Note:  History: [name] 02/19/2019  Referring physician: Mechele Claude, MD  Reason for consult/chief complaint: Abdominal Pain (intermittent x years; epigastric and RUQ abdominal pain has gotten very frequent; feels like pressure and can vary in intensity; seems to worsen when she is full); Chest Pain (with belching and bloating; notes this has been happening x about 6 months; chest pain feels like heartburn but can also radiate toward the back at times); and Constipation (x years however notes this is a "different constipation" ; has had a few episodes of brbpr)   Subjective  HPI:  This is a very pleasant 43 year old woman referred by primary care. She was recently seen by Dr. Darlyn Read of primary care for about 6 months of pain in the epigastrium and right upper quadrant, described as a burning and gnawing sensation, sometimes worse after meals.  She had been on prednisone for some time but it was discontinued and then Protonix prescribed, but she has not yet started it.  She describes many years of constipation with a BM perhaps twice a week.  She will sometimes take Ex-Lax, but does not like the cramps that it causes.  She has taken a few doses of MiraLAX with some improvement, has not taken anything like that regularly.  Recently she feels like the constipation is changed, where it is less responsive to diet increased exercise and use of laxatives.  Bothers her more so is about 9 months of an upper abdominal pain that is epigastric and in the right upper quadrant.  It is there nearly all the time as an ache and a pressure that can vary in intensity.  It seems worse after a larger portion of food.  He has frequent belching and bloating times feel that the pain radiates into the back. She denies nausea vomiting dysphagia or weight loss.  Denies rectal bleeding.  ROS:  Review of Systems  Constitutional: Negative for appetite change and  unexpected weight change.  HENT: Negative for mouth sores and voice change.   Eyes: Negative for pain and redness.  Respiratory: Negative for cough and shortness of breath.   Cardiovascular: Negative for chest pain and palpitations.  Genitourinary: Negative for dysuria and hematuria.  Musculoskeletal: Negative for arthralgias and myalgias.  Skin: Negative for pallor and rash.  Allergic/Immunologic: Positive for environmental allergies.  Neurological: Negative for weakness and headaches.  Hematological: Negative for adenopathy.     Past Medical History: Past Medical History:  Diagnosis Date  . GERD (gastroesophageal reflux disease)      Past Surgical History: Past Surgical History:  Procedure Laterality Date  . bladder stent     due to one kidney being smaller than the other- surgery at childhood  . CHOLECYSTECTOMY    . FOOT SURGERY Right      Family History: Family History  Problem Relation Age of Onset  . Breast cancer Mother   . Diabetes Mother   . Colon cancer Neg Hx   . Esophageal cancer Neg Hx   . Stomach cancer Neg Hx   . Liver disease Neg Hx     Social History: Social History   Socioeconomic History  . Marital status: Married    Spouse name: Not on file  . Number of children: Not on file  . Years of education: Not on file  . Highest education level: Not on file  Occupational History  . Not on file  Social Needs  . Financial resource strain: Not on  file  . Food insecurity:    Worry: Not on file    Inability: Not on file  . Transportation needs:    Medical: Not on file    Non-medical: Not on file  Tobacco Use  . Smoking status: Never Smoker  . Smokeless tobacco: Never Used  Substance and Sexual Activity  . Alcohol use: Yes    Comment: 2 drinks per week  . Drug use: No  . Sexual activity: Not on file  Lifestyle  . Physical activity:    Days per week: Not on file    Minutes per session: Not on file  . Stress: Not on file  Relationships  .  Social connections:    Talks on phone: Not on file    Gets together: Not on file    Attends religious service: Not on file    Active member of club or organization: Not on file    Attends meetings of clubs or organizations: Not on file    Relationship status: Not on file  Other Topics Concern  . Not on file  Social History Narrative  . 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  Allergies: Allergies  Allergen Reactions  . Penicillins   . Ivp Dye [Iodinated Diagnostic Agents] Rash    Outpatient Meds: Current Outpatient Medications  Medication Sig Dispense Refill  . calcium carbonate (TUMS - DOSED IN MG ELEMENTAL CALCIUM) 500 MG chewable tablet Chew 1 tablet by mouth as needed for indigestion or heartburn.    . fexofenadine (ALLEGRA) 180 MG tablet Take 180 mg by mouth daily as needed.     Marland Kitchen. omeprazole (PRILOSEC) 20 MG capsule Take 20 mg by mouth daily as needed.     No current facility-administered medications for this visit.       ___________________________________________________________________ Objective   Exam:  BP 126/82   Pulse 68   Ht 5\' 7"  (1.702 m)   Wt 246 lb 3.2 oz (111.7 kg)   BMI 38.56 kg/m  Her husband is with her for the entire encounter.  General: Well-appearing, pleasant and conversational.  Eyes: sclera anicteric, no redness  ENT: oral mucosa moist without lesions, no cervical or supraclavicular lymphadenopathy  CV: RRR without murmur, S1/S2, no JVD, no peripheral edema  Resp: clear to auscultation bilaterally, normal RR and effort noted  GI: soft, epigastric and RUQ tenderness to light palpation abdominal wall, with active bowel sounds. No guarding or palpable organomegaly noted.  Skin; warm and dry, no rash or jaundice noted  Neuro: awake, alert and oriented x 3. Normal gross motor function and fluent speech  Labs:  CBC Latest Ref Rng & Units 01/28/2019  WBC 3.4 - 10.8 x10E3/uL 6.3  Hemoglobin 11.1 - 15.9 g/dL 69.614.2    Hematocrit 29.534.0 - 46.6 % 41.8  Platelets 150 - 450 x10E3/uL 204   CMP Latest Ref Rng & Units 01/28/2019 11/18/2017 10/25/2016  Glucose 65 - 99 mg/dL 284(X196(H) 324(M119(H) 010(U133(H)  BUN 6 - 24 mg/dL 8 6 11   Creatinine 0.57 - 1.00 mg/dL 7.250.67 3.66(Y0.55(L) 4.030.74  Sodium 134 - 144 mmol/L 137 141 141  Potassium 3.5 - 5.2 mmol/L 4.1 4.1 4.4  Chloride 96 - 106 mmol/L 101 102 100  CO2 20 - 29 mmol/L 21 25 24   Calcium 8.7 - 10.2 mg/dL 9.2 9.1 9.5  Total Protein 6.0 - 8.5 g/dL 6.7 6.8 7.2  Total Bilirubin 0.0 - 1.2 mg/dL 0.4 0.5 0.6  Alkaline Phos 39 - 117 IU/L 89  73 81  AST 0 - 40 IU/L 22 17 22   ALT 0 - 32 IU/L 29 20 36(H)     Radiologic Studies:  No recent abdominal imaging  Assessment: Encounter Diagnoses  Name Primary?  Marland Kitchen Upper abdominal pain Yes  . Abdominal bloating   . Chronic constipation   . Rectal bleeding     Constellation of symptoms, longstanding constipation that seems to have changed lately and less responsive to her usual interventions.  Upper abdominal pain with fullness and bloating and belching, status post cholecystectomy.  Plan:  Upper endoscopy and colonoscopy.  She is agreeable after discussion of procedure and risks.  The benefits and risks of the planned procedure were described in detail with the patient or (when appropriate) their health care proxy.  Risks were outlined as including, but not limited to, bleeding, infection, perforation, adverse medication reaction leading to cardiac or pulmonary decompensation.  The limitation of incomplete mucosal visualization was also discussed.  No guarantees or warranties were given.  Take one half to a full capful of MiraLAX daily, as I think it will likely work better than as needed use.  Thank you for the courtesy of this consult.  Please call me with any questions or concerns.  Charlie Pitter III  CC: Referring provider noted above

## 2019-03-10 ENCOUNTER — Encounter: Payer: BLUE CROSS/BLUE SHIELD | Admitting: Gastroenterology

## 2019-03-11 ENCOUNTER — Ambulatory Visit: Payer: BLUE CROSS/BLUE SHIELD | Admitting: Family Medicine

## 2019-03-19 ENCOUNTER — Other Ambulatory Visit: Payer: Self-pay | Admitting: Family Medicine

## 2019-03-19 ENCOUNTER — Telehealth: Payer: Self-pay | Admitting: Family Medicine

## 2019-03-19 DIAGNOSIS — R220 Localized swelling, mass and lump, head: Secondary | ICD-10-CM

## 2019-03-19 MED ORDER — CHLORHEXIDINE GLUCONATE 0.12 % MT SOLN: 15.0000 mL | Freq: Two times a day (BID) | OROMUCOSAL | 0 refills | Status: DC

## 2019-03-19 NOTE — Telephone Encounter (Signed)
Patient of Stacks. Please review and advise 

## 2019-03-19 NOTE — Telephone Encounter (Signed)
Peridex mouthwash sent to [facility]. Use as prescribed. Report any worsening symptoms.

## 2019-03-19 NOTE — Telephone Encounter (Signed)
Patient notified and verbalized understanding. 

## 2019-03-21 ENCOUNTER — Other Ambulatory Visit: Payer: Self-pay | Admitting: Family

## 2019-03-21 DIAGNOSIS — K047 Periapical abscess without sinus: Secondary | ICD-10-CM

## 2019-03-21 DIAGNOSIS — R6884 Jaw pain: Secondary | ICD-10-CM | POA: Diagnosis not present

## 2019-03-21 MED ORDER — CLINDAMYCIN HCL 300 MG PO CAPS: 300.0000 mg | ORAL_CAPSULE | Freq: Four times a day (QID) | ORAL | 0 refills | Status: AC

## 2019-03-21 NOTE — Progress Notes (Signed)
   Virtual Visit via telephone Note  I connected with Berniece Salines on 03/21/19 at 8:56 AM by telephone and verified that I am speaking with the correct person using two identifiers. [name] is currently located at work and no one is currently with her during visit. The provider, Jannifer Rodney, FNP is located in their office at time of visit.  I discussed the limitations, risks, security and privacy concerns of performing an evaluation and management service by telephone and the availability of in person appointments. I also discussed with the patient that there may be a patient responsible charge related to this service. The patient expressed understanding and agreed to proceed.   History and Present Illness:  PT calls today complaining of right lower jaw pain that started over a week ago, but is worsening. She called and spoke with her PCP on 03/19/19 who prescribed Peridex mouthwash with no relief. She has been taking motrin around the clock and gargling with warm salt water with no relief. She has tried to eat a soft diet, but no relief.   She reports intermittent aching pain of 8 out 10. States the "knot" on her jaw is worse and swelling.   Observations/Objective: No SOB or distress   Assessment and Plan: 1. Abscessed tooth Continue with warm water gargle & Peridex mouthwash Motrin every 6 hours Continue soft diet Call dentist Call office if worsen or does not improve  - clindamycin (CLEOCIN) 300 MG capsule; Take 1 capsule (300 mg total) by mouth 4 (four) times daily for 7 days.  Dispense: 28 capsule; Refill: 0    I discussed the assessment and treatment plan with the patient. The patient was provided an opportunity to ask questions and all were answered. The patient agreed with the plan and demonstrated an understanding of the instructions.   The patient was advised to call back or seek an in-person evaluation if the symptoms worsen or if the condition fails  to improve as anticipated.  The above assessment and management plan was discussed with the patient. The patient verbalized understanding of and has agreed to the management plan. Patient is aware to call the clinic if symptoms persist or worsen. Patient is aware when to return to the clinic for a follow-up visit. Patient educated on when it is appropriate to go to the emergency department.    Call ended 9:08AM, I  provided 12 minutes of non-face-to-face time during this encounter.    Jannifer Rodney, FNP

## 2019-04-12 ENCOUNTER — Telehealth: Payer: Self-pay | Admitting: Gastroenterology

## 2019-04-12 NOTE — Telephone Encounter (Signed)
This patient's EGD and colonoscopy for 3/24 were canceled due to COVID.  Please contact her and offer reschedule for May or June if she is agreeable.

## 2019-04-27 NOTE — Telephone Encounter (Signed)
LM on VM for patient to call back to reschedule postponed procedure.  

## 2019-04-29 NOTE — Telephone Encounter (Signed)
LM on VM for patient to call back to reschedule postponed procedure.  

## 2019-05-04 NOTE — Telephone Encounter (Signed)
Unable to reach patient by phone.  Will plan to send a letter to patient for her to call back to reschedule.    

## 2019-05-13 ENCOUNTER — Telehealth: Payer: Self-pay

## 2019-05-13 NOTE — Telephone Encounter (Signed)
ERROR

## 2019-05-26 NOTE — Telephone Encounter (Signed)
Does 3rd floor need to do this or is [facility] working on this? Please advise.Marland KitchenMarland KitchenMarland Kitchen

## 2019-06-16 ENCOUNTER — Encounter: Payer: Self-pay | Admitting: Gastroenterology

## 2019-07-14 DIAGNOSIS — Z130 Encounter for screening for diseases of the blood and blood-forming organs and certain disorders involving the immune mechanism: Secondary | ICD-10-CM | POA: Diagnosis not present

## 2019-07-14 DIAGNOSIS — N920 Excessive and frequent menstruation with regular cycle: Secondary | ICD-10-CM | POA: Diagnosis not present

## 2019-07-14 DIAGNOSIS — R7309 Other abnormal glucose: Secondary | ICD-10-CM | POA: Diagnosis not present

## 2019-07-14 DIAGNOSIS — Z0000 Encounter for general adult medical examination without abnormal findings: Secondary | ICD-10-CM | POA: Diagnosis not present

## 2019-07-31 DIAGNOSIS — E119 Type 2 diabetes mellitus without complications: Secondary | ICD-10-CM | POA: Diagnosis not present

## 2019-07-31 DIAGNOSIS — N920 Excessive and frequent menstruation with regular cycle: Secondary | ICD-10-CM | POA: Diagnosis not present

## 2019-07-31 DIAGNOSIS — R945 Abnormal results of liver function studies: Secondary | ICD-10-CM | POA: Diagnosis not present

## 2019-10-21 ENCOUNTER — Other Ambulatory Visit: Payer: Self-pay | Admitting: Family Medicine

## 2019-10-21 DIAGNOSIS — Z1231 Encounter for screening mammogram for malignant neoplasm of breast: Secondary | ICD-10-CM

## 2019-12-07 ENCOUNTER — Encounter: Payer: BC Managed Care – PPO | Admitting: Family Medicine

## 2019-12-07 ENCOUNTER — Encounter: Payer: Self-pay | Admitting: Family Medicine

## 2019-12-09 ENCOUNTER — Ambulatory Visit: Payer: BLUE CROSS/BLUE SHIELD

## 2020-08-24 ENCOUNTER — Ambulatory Visit: Payer: Self-pay

## 2020-08-31 ENCOUNTER — Ambulatory Visit: Payer: Self-pay

## 2020-10-17 ENCOUNTER — Ambulatory Visit (INDEPENDENT_AMBULATORY_CARE_PROVIDER_SITE_OTHER): Payer: BC Managed Care – PPO | Admitting: Family Medicine

## 2020-10-17 DIAGNOSIS — Z8616 Personal history of COVID-19: Secondary | ICD-10-CM

## 2020-10-17 DIAGNOSIS — R053 Chronic cough: Secondary | ICD-10-CM | POA: Diagnosis not present

## 2020-10-17 MED ORDER — PROMETHAZINE-DM 6.25-15 MG/5ML PO SYRP: 2.5000 mL | ORAL_SOLUTION | Freq: Four times a day (QID) | ORAL | 0 refills | Status: DC | PRN

## 2020-10-17 MED ORDER — BENZONATATE 100 MG PO CAPS: 100.0000 mg | ORAL_CAPSULE | Freq: Three times a day (TID) | ORAL | 0 refills | Status: DC | PRN

## 2020-10-17 NOTE — Progress Notes (Signed)
Telephone visit  Subjective: CC: cough PCP: Mechele Claude, MD [id number] [name] is a 44 y.o. female calls for telephone consult today. Patient provides verbal consent for consult held via phone.  Due to COVID-19 pandemic this visit was conducted virtually. This visit type was conducted due to national recommendations for restrictions regarding the COVID-19 Pandemic (e.g. social distancing, sheltering in place) in an effort to limit this patient's exposure and mitigate transmission in our community. All issues noted in this document were discussed and addressed.  A physical exam was not performed with this format.   Location of patient: home Location of provider: [facility] Others present for call: none  1. Cough Patient reports dry nagging cough x2 weeks.  She denies congestion, nasal stuffiness.  She is taking OTC meds without improvement. No fever, chills, myalgia, SOB, wheezing.  She reports itchy throat.  She takes [employer].  She had COVID in September 2021.  Those symptoms resolved.   ROS: Per HPI  Allergies  Allergen Reactions   Penicillins    Ivp Dye [Iodinated Diagnostic Agents] Rash   Past Medical History:  Diagnosis Date   GERD (gastroesophageal reflux disease)     Current Outpatient Medications:    calcium carbonate (TUMS - DOSED IN MG ELEMENTAL CALCIUM) 500 MG chewable tablet, Chew 1 tablet by mouth as needed for indigestion or heartburn., Disp: , Rfl:    chlorhexidine (PERIDEX) 0.12 % solution, Use as directed 15 mLs in the mouth or throat 2 (two) times daily., Disp: 120 mL, Rfl: 0   fexofenadine (ALLEGRA) 180 MG tablet, Take 180 mg by mouth daily as needed. , Disp: , Rfl:    omeprazole (PRILOSEC) 20 MG capsule, Take 20 mg by mouth daily as needed., Disp: , Rfl:   Assessment/ Plan: 44 y.o. female   1. Persistent dry cough Likely allergy mediated versus viral.  I highly doubt that this would be COVID-19 given recent infection.  She should still have some  level of natural immunity at this point.  She also demonstrates no other infectious symptoms to suggest bacterial infection.  We discussed resuming use of Allegra daily.  May use the below cough suppressants.  If symptoms do not improve or if they abruptly worsen, will plan for chest x-ray.  She is aware of recommendations and reasons for return.  She will follow-up as needed - benzonatate (TESSALON PERLES) 100 MG capsule; Take 1 capsule (100 mg total) by mouth 3 (three) times daily as needed.  Dispense: 20 capsule; Refill: 0 - promethazine-dextromethorphan (PROMETHAZINE-DM) 6.25-15 MG/5ML syrup; Take 2.5 mLs by mouth 4 (four) times daily as needed for cough.  Dispense: 118 mL; Refill: 0  Start time: 8:36am (no answer); 8:41am (LVM); 8:44am End time: 8:51am  Total time spent on patient care (including telephone call/ virtual visit): 7 minutes  [name] Hulen Skains, DO [facility] [phone]

## 2020-10-18 ENCOUNTER — Other Ambulatory Visit: Payer: Self-pay | Admitting: Obstetrics

## 2020-10-18 DIAGNOSIS — Z1231 Encounter for screening mammogram for malignant neoplasm of breast: Secondary | ICD-10-CM

## 2020-11-16 ENCOUNTER — Encounter: Payer: BC Managed Care – PPO | Admitting: Family Medicine

## 2020-11-28 ENCOUNTER — Ambulatory Visit
Admission: RE | Admit: 2020-11-28 | Discharge: 2020-11-28 | Disposition: A | Discharge status: Home / Self Care | Payer: BC Managed Care – PPO | Source: Ambulatory Visit | Attending: Obstetrics | Admitting: Obstetrics

## 2020-11-28 ENCOUNTER — Other Ambulatory Visit: Payer: Self-pay

## 2020-11-28 DIAGNOSIS — Z1231 Encounter for screening mammogram for malignant neoplasm of breast: Secondary | ICD-10-CM | POA: Diagnosis not present

## 2020-11-29 DIAGNOSIS — Z01419 Encounter for gynecological examination (general) (routine) without abnormal findings: Secondary | ICD-10-CM | POA: Diagnosis not present

## 2020-11-29 DIAGNOSIS — Z6838 Body mass index (BMI) 38.0-38.9, adult: Secondary | ICD-10-CM | POA: Diagnosis not present

## 2020-11-29 DIAGNOSIS — Z124 Encounter for screening for malignant neoplasm of cervix: Secondary | ICD-10-CM | POA: Diagnosis not present

## 2020-11-29 DIAGNOSIS — Z01411 Encounter for gynecological examination (general) (routine) with abnormal findings: Secondary | ICD-10-CM | POA: Diagnosis not present

## 2020-12-05 ENCOUNTER — Other Ambulatory Visit: Payer: Self-pay | Admitting: Obstetrics

## 2020-12-05 DIAGNOSIS — R928 Other abnormal and inconclusive findings on diagnostic imaging of breast: Secondary | ICD-10-CM

## 2020-12-08 ENCOUNTER — Other Ambulatory Visit: Payer: Self-pay

## 2020-12-08 ENCOUNTER — Other Ambulatory Visit: Payer: Self-pay | Admitting: Obstetrics

## 2020-12-08 ENCOUNTER — Ambulatory Visit
Admission: RE | Admit: 2020-12-08 | Discharge: 2020-12-08 | Disposition: A | Discharge status: Home / Self Care | Payer: BC Managed Care – PPO | Source: Ambulatory Visit | Attending: Obstetrics | Admitting: Obstetrics

## 2020-12-08 DIAGNOSIS — R928 Other abnormal and inconclusive findings on diagnostic imaging of breast: Secondary | ICD-10-CM

## 2020-12-08 DIAGNOSIS — N6489 Other specified disorders of breast: Secondary | ICD-10-CM | POA: Diagnosis not present

## 2020-12-08 DIAGNOSIS — R599 Enlarged lymph nodes, unspecified: Secondary | ICD-10-CM

## 2020-12-12 ENCOUNTER — Other Ambulatory Visit: Payer: Self-pay

## 2020-12-12 ENCOUNTER — Ambulatory Visit (INDEPENDENT_AMBULATORY_CARE_PROVIDER_SITE_OTHER): Payer: BC Managed Care – PPO | Admitting: Family Medicine

## 2020-12-12 ENCOUNTER — Encounter: Payer: Self-pay | Admitting: Family Medicine

## 2020-12-12 VITALS — BP 129/83 | HR 60 | Temp 97.7°F | Resp 20 | Ht 67.0 in | Wt 241.2 lb

## 2020-12-12 DIAGNOSIS — E1165 Type 2 diabetes mellitus with hyperglycemia: Secondary | ICD-10-CM | POA: Diagnosis not present

## 2020-12-12 DIAGNOSIS — R748 Abnormal levels of other serum enzymes: Secondary | ICD-10-CM

## 2020-12-12 DIAGNOSIS — Z0000 Encounter for general adult medical examination without abnormal findings: Secondary | ICD-10-CM

## 2020-12-12 DIAGNOSIS — Z0001 Encounter for general adult medical examination with abnormal findings: Secondary | ICD-10-CM | POA: Diagnosis not present

## 2020-12-12 DIAGNOSIS — Z136 Encounter for screening for cardiovascular disorders: Secondary | ICD-10-CM | POA: Diagnosis not present

## 2020-12-12 LAB — BAYER DCA HB A1C WAIVED: HB A1C (BAYER DCA - WAIVED): 9.3 % — ABNORMAL HIGH (ref <7.0)

## 2020-12-13 LAB — CBC WITH DIFFERENTIAL/PLATELET
Basophils Absolute: 0 10*3/uL (ref 0.0–0.2)
Basos: 1 %
EOS (ABSOLUTE): 0.1 10*3/uL (ref 0.0–0.4)
Eos: 2 %
Hematocrit: 43.5 % (ref 34.0–46.6)
Hemoglobin: 14.2 g/dL (ref 11.1–15.9)
Immature Grans (Abs): 0 10*3/uL (ref 0.0–0.1)
Immature Granulocytes: 1 %
Lymphocytes Absolute: 1.7 10*3/uL (ref 0.7–3.1)
Lymphs: 29 %
MCH: 29.6 pg (ref 26.6–33.0)
MCHC: 32.6 g/dL (ref 31.5–35.7)
MCV: 91 fL (ref 79–97)
Monocytes Absolute: 0.5 10*3/uL (ref 0.1–0.9)
Monocytes: 9 %
Neutrophils Absolute: 3.6 10*3/uL (ref 1.4–7.0)
Neutrophils: 58 %
Platelets: 159 10*3/uL (ref 150–450)
RBC: 4.8 x10E6/uL (ref 3.77–5.28)
RDW: 12.1 % (ref 11.7–15.4)
WBC: 5.9 10*3/uL (ref 3.4–10.8)

## 2020-12-13 LAB — LIPID PANEL
Chol/HDL Ratio: 3.2 ratio (ref 0.0–4.4)
Cholesterol, Total: 141 mg/dL (ref 100–199)
HDL: 44 mg/dL (ref >39)
LDL Chol Calc (NIH): 84 mg/dL (ref 0–99)
Triglycerides: 61 mg/dL (ref 0–149)
VLDL Cholesterol Cal: 13 mg/dL (ref 5–40)

## 2020-12-13 LAB — CMP14+EGFR
ALT: 120 IU/L — ABNORMAL HIGH (ref 0–32)
AST: 94 IU/L — ABNORMAL HIGH (ref 0–40)
Albumin/Globulin Ratio: 1.5 (ref 1.2–2.2)
Albumin: 4 g/dL (ref 3.8–4.8)
Alkaline Phosphatase: 103 IU/L (ref 44–121)
BUN/Creatinine Ratio: 14 (ref 9–23)
BUN: 8 mg/dL (ref 6–24)
Bilirubin Total: 0.5 mg/dL (ref 0.0–1.2)
CO2: 24 mmol/L (ref 20–29)
Calcium: 9 mg/dL (ref 8.7–10.2)
Chloride: 102 mmol/L (ref 96–106)
Creatinine, Ser: 0.57 mg/dL (ref 0.57–1.00)
GFR calc Af Amer: 129 mL/min/{1.73_m2} (ref >59)
GFR calc non Af Amer: 112 mL/min/{1.73_m2} (ref >59)
Globulin, Total: 2.6 g/dL (ref 1.5–4.5)
Glucose: 251 mg/dL — ABNORMAL HIGH (ref 65–99)
Potassium: 4.3 mmol/L (ref 3.5–5.2)
Sodium: 138 mmol/L (ref 134–144)
Total Protein: 6.6 g/dL (ref 6.0–8.5)

## 2020-12-14 ENCOUNTER — Other Ambulatory Visit (HOSPITAL_COMMUNITY): Payer: Self-pay | Admitting: Diagnostic Radiology

## 2020-12-14 ENCOUNTER — Ambulatory Visit
Admission: RE | Admit: 2020-12-14 | Discharge: 2020-12-14 | Disposition: A | Discharge status: Home / Self Care | Payer: BC Managed Care – PPO | Source: Ambulatory Visit | Attending: Obstetrics | Admitting: Obstetrics

## 2020-12-14 ENCOUNTER — Other Ambulatory Visit: Payer: Self-pay | Admitting: Obstetrics

## 2020-12-14 ENCOUNTER — Other Ambulatory Visit: Payer: Self-pay

## 2020-12-14 ENCOUNTER — Encounter: Payer: Self-pay | Admitting: Family Medicine

## 2020-12-14 ENCOUNTER — Other Ambulatory Visit (HOSPITAL_COMMUNITY)
Admission: RE | Admit: 2020-12-14 | Discharge: 2020-12-14 | Disposition: A | Discharge status: Home / Self Care | Payer: BC Managed Care – PPO | Source: Ambulatory Visit | Attending: Diagnostic Radiology | Admitting: Diagnostic Radiology

## 2020-12-14 DIAGNOSIS — R599 Enlarged lymph nodes, unspecified: Secondary | ICD-10-CM

## 2020-12-14 DIAGNOSIS — R590 Localized enlarged lymph nodes: Secondary | ICD-10-CM | POA: Diagnosis not present

## 2020-12-14 NOTE — Progress Notes (Signed)
Subjective:  Patient ID: [name], female    DOB: 1976-09-23  Age: 44 y.o. MRN: [id number]  CC: Medical Management of Chronic Issues and Annual Exam   HPI [name] presents forannual physical and Follow-up of diabetes. Patient does not check blood sugar at home.  She is unwilling to try medication, or to see clinical pharmacist for education.  Patient denies symptoms such as polyuria, polydipsia, excessive hunger, nausea No significant hypoglycemic spells noted.   History [name] has a past medical history of GERD (gastroesophageal reflux disease).   She has a past surgical history that includes Cholecystectomy; Foot surgery (Right); and bladder stent.   Her family history includes Breast cancer in her mother; Diabetes in her mother.She reports that she has never smoked. She has never used smokeless tobacco. She reports current alcohol use. She reports that she does not use drugs.  Current Outpatient Medications on File Prior to Visit  Medication Sig Dispense Refill   fexofenadine (ALLEGRA) 180 MG tablet Take 180 mg by mouth daily as needed.      omeprazole (PRILOSEC) 20 MG capsule Take 20 mg by mouth daily as needed.     No current facility-administered medications on file prior to visit.    ROS Review of Systems  Constitutional: Negative.   HENT: Negative.  Negative for congestion.   Eyes: Negative for visual disturbance.  Respiratory: Negative for shortness of breath.   Cardiovascular: Negative for chest pain.  Gastrointestinal: Negative for abdominal pain, constipation, diarrhea, nausea and vomiting.  Genitourinary: Negative for difficulty urinating.  Musculoskeletal: Negative for arthralgias and myalgias.  Neurological: Negative for headaches.  Psychiatric/Behavioral: Negative for sleep disturbance.    Objective:  BP 129/83    Pulse 60    Temp 97.7 F (36.5 C) (Temporal)    Resp 20    Ht 5' 7"  (1.702 m)    Wt 241 lb 4 oz (109.4 kg)     SpO2 99%    BMI 37.79 kg/m   BP Readings from Last 3 Encounters:  12/12/20 129/83  02/19/19 126/82  01/28/19 128/87    Wt Readings from Last 3 Encounters:  12/12/20 241 lb 4 oz (109.4 kg)  02/19/19 246 lb 3.2 oz (111.7 kg)  01/28/19 247 lb 6 oz (112.2 kg)     Physical Exam Constitutional:      General: She is not in acute distress.    Appearance: She is well-developed and well-nourished.  HENT:     Head: Normocephalic and atraumatic.     Right Ear: External ear normal.     Left Ear: External ear normal.  Cardiovascular:     Rate and Rhythm: Normal rate and regular rhythm.     Heart sounds: Normal heart sounds. No murmur heard.   Pulmonary:     Effort: Pulmonary effort is normal. No respiratory distress.     Breath sounds: Normal breath sounds. No wheezing or rales.  Abdominal:     Palpations: Abdomen is soft.     Tenderness: There is no abdominal tenderness.  Musculoskeletal:        General: No tenderness or edema. Normal range of motion.     Cervical back: Normal range of motion and neck supple.  Skin:    General: Skin is warm and dry.  Neurological:     Mental Status: She is alert and oriented to person, place, and time.     Motor: No abnormal muscle tone.     Coordination: Coordination normal.  Psychiatric:  Mood and Affect: Mood and affect normal.        Behavior: Behavior normal.       Assessment & Plan:   [name] was seen today for medical management of chronic issues and annual exam.  Diagnoses and all orders for this visit:  Annual physical exam -     Microalbumin / creatinine urine ratio -     Bayer DCA Hb A1c Waived -     CBC with Differential/Platelet -     CMP14+EGFR -     Lipid panel  Uncontrolled type 2 diabetes mellitus with hyperglycemia (HCC)  Elevated liver enzymes   Patient declines any further treatment for diabetes.  We discussed the risk factors involved and an elevated A1c.  Today it was 9.2.  We discussed which she  declined as well.  Additionally she declined gynecological exam including breast exam.     I have discontinued [name]'s calcium carbonate, chlorhexidine, benzonatate, and promethazine-dextromethorphan. I am also having her maintain her fexofenadine and omeprazole.  No orders of the defined types were placed in this encounter.    Follow-up: Return in about 3 months (around 03/12/2021) for diabetes.  [name], M.D.

## 2020-12-15 LAB — SURGICAL PATHOLOGY

## 2020-12-19 ENCOUNTER — Telehealth: Payer: Self-pay

## 2021-04-17 ENCOUNTER — Encounter: Payer: Self-pay | Admitting: Family Medicine

## 2021-04-17 ENCOUNTER — Ambulatory Visit (INDEPENDENT_AMBULATORY_CARE_PROVIDER_SITE_OTHER): Payer: BC Managed Care – PPO | Admitting: Family Medicine

## 2021-04-17 ENCOUNTER — Other Ambulatory Visit: Payer: Self-pay

## 2021-04-17 VITALS — BP 150/72 | HR 68 | Ht 67.0 in | Wt 237.0 lb

## 2021-04-17 DIAGNOSIS — S39012A Strain of muscle, fascia and tendon of lower back, initial encounter: Secondary | ICD-10-CM

## 2021-04-17 MED ORDER — METHYLPREDNISOLONE ACETATE 40 MG/ML IJ SUSP
80.0000 mg | Freq: Once | INTRAMUSCULAR | Status: AC
  Administered 2021-04-17: 80 mg via INTRAMUSCULAR

## 2021-04-17 MED ORDER — CYCLOBENZAPRINE HCL 10 MG PO TABS
10.0000 mg | ORAL_TABLET | Freq: Three times a day (TID) | ORAL | 0 refills | Status: DC | PRN
Start: 2021-04-17 — End: 2021-12-14

## 2021-04-17 NOTE — Progress Notes (Signed)
BP (!) 150/72   Pulse 68   Ht 5\' 7"  (1.702 m)   Wt 237 lb (107.5 kg)   SpO2 98%   BMI 37.12 kg/m    Subjective:   Patient ID: , female    DOB: [birth year], 45 y.o.   MRN: [id number]  HPI: [name] is a 45 y.o. female presenting on 04/17/2021 for Back Pain (Mid lower back)   HPI Patient is coming in today complaining of lower back pain that started 1 week ago, she feels like she may have some swelling in her lower back and is wondering if she has a bulge.  Patient is having muscle spasms and pain going down to her buttocks on both sides.  She denies any numbness or weakness down either leg.  She denies any loss of bowel or bladder.  She says that it is worse with sitting for long periods of time, gets some relief with standing or laying down.  Relevant past medical, surgical, family and social history reviewed and updated as indicated. Interim medical history since our last visit reviewed. Allergies and medications reviewed and updated.  Review of Systems  Constitutional: Negative for chills and fever.  Eyes: Negative for visual disturbance.  Respiratory: Negative for chest tightness and shortness of breath.   Cardiovascular: Negative for chest pain and leg swelling.  Gastrointestinal: Negative for blood in stool.  Musculoskeletal: Positive for back pain and myalgias. Negative for arthralgias, gait problem, neck pain and neck stiffness.  Skin: Negative for rash.  Psychiatric/Behavioral: Negative for agitation and behavioral problems.  All other systems reviewed and are negative.   Per HPI unless specifically indicated above   Allergies as of 04/17/2021      Reactions   Penicillins    Ivp Dye [iodinated Diagnostic Agents] Rash      Medication List       Accurate as of Apr 17, 2021 10:18 AM. If you have any questions, ask your nurse or doctor.        STOP taking these medications   fexofenadine 180 MG tablet Commonly known as:  ALLEGRA Stopped by: Apr 19, 2021 Megan Baxendale, MD   omeprazole 20 MG capsule Commonly known as: PRILOSEC Stopped by: Megan Radon, MD     TAKE these medications   cyclobenzaprine 10 MG tablet Commonly known as: FLEXERIL Take 1 tablet (10 mg total) by mouth 3 (three) times daily as needed for muscle spasms. Started by: Megan Pyle Aryaa Bunting, MD   ibuprofen 200 MG tablet Commonly known as: ADVIL Take 200 mg by mouth every 6 (six) hours as needed.        Objective:   BP (!) 150/72   Pulse 68   Ht 5\' 7"  (1.702 m)   Wt 237 lb (107.5 kg)   SpO2 98%   BMI 37.12 kg/m   Wt Readings from Last 3 Encounters:  04/17/21 237 lb (107.5 kg)  12/12/20 241 lb 4 oz (109.4 kg)  02/19/19 246 lb 3.2 oz (111.7 kg)    Physical Exam Vitals and nursing note reviewed.  Constitutional:      Appearance: Normal appearance.  Musculoskeletal:     Lumbar back: Tenderness (Bilateral lumbar paraspinal tenderness and midline tenderness around the elbow 3 region) and bony tenderness present. No deformity or spasms. Normal range of motion. Negative right straight leg raise test and negative left straight leg raise test.  Skin:    General: Skin is warm and dry.     Findings: No  erythema.  Neurological:     Mental Status: She is alert.       Assessment & Plan:   Problem List Items Addressed This Visit   None   Visit Diagnoses    Strain of lumbar region, initial encounter    -  Primary   Relevant Medications   methylPREDNISolone acetate (DEPO-MEDROL) injection 80 mg (Completed)   cyclobenzaprine (FLEXERIL) 10 MG tablet      Will start with steroids and muscle relaxer then given stretches, she is planning to go see a chiropractor. Follow up plan: Return if symptoms worsen or fail to improve.  Counseling provided for all of the vaccine components No orders of the defined types were placed in this encounter.   Megan Care, MD [facility] 04/17/2021, 10:18  AM

## 2021-08-07 ENCOUNTER — Other Ambulatory Visit: Payer: Self-pay

## 2021-08-07 ENCOUNTER — Encounter: Payer: Self-pay | Admitting: Family Medicine

## 2021-08-07 ENCOUNTER — Ambulatory Visit (INDEPENDENT_AMBULATORY_CARE_PROVIDER_SITE_OTHER): Payer: BC Managed Care – PPO | Admitting: Family Medicine

## 2021-08-07 VITALS — BP 145/82 | HR 62 | Temp 97.7°F | Ht 67.0 in | Wt 231.8 lb

## 2021-08-07 DIAGNOSIS — E1165 Type 2 diabetes mellitus with hyperglycemia: Secondary | ICD-10-CM

## 2021-08-07 LAB — CBC WITH DIFFERENTIAL/PLATELET
Basophils Absolute: 0 10*3/uL (ref 0.0–0.2)
Basos: 0 %
EOS (ABSOLUTE): 0.1 10*3/uL (ref 0.0–0.4)
Eos: 2 %
Hematocrit: 39.9 % (ref 34.0–46.6)
Hemoglobin: 13.2 g/dL (ref 11.1–15.9)
Immature Grans (Abs): 0 10*3/uL (ref 0.0–0.1)
Immature Granulocytes: 0 %
Lymphocytes Absolute: 1.6 10*3/uL (ref 0.7–3.1)
Lymphs: 30 %
MCH: 28.4 pg (ref 26.6–33.0)
MCHC: 33.1 g/dL (ref 31.5–35.7)
MCV: 86 fL (ref 79–97)
Monocytes Absolute: 0.5 10*3/uL (ref 0.1–0.9)
Monocytes: 9 %
Neutrophils Absolute: 3.2 10*3/uL (ref 1.4–7.0)
Neutrophils: 59 %
Platelets: 186 10*3/uL (ref 150–450)
RBC: 4.65 x10E6/uL (ref 3.77–5.28)
RDW: 12.6 % (ref 11.7–15.4)
WBC: 5.4 10*3/uL (ref 3.4–10.8)

## 2021-08-07 LAB — BAYER DCA HB A1C WAIVED: HB A1C (BAYER DCA - WAIVED): 8.8 % — ABNORMAL HIGH (ref <7.0)

## 2021-08-07 LAB — LIPID PANEL
Chol/HDL Ratio: 2.9 ratio (ref 0.0–4.4)
Cholesterol, Total: 116 mg/dL (ref 100–199)
HDL: 40 mg/dL (ref >39)
LDL Chol Calc (NIH): 64 mg/dL (ref 0–99)
Triglycerides: 53 mg/dL (ref 0–149)
VLDL Cholesterol Cal: 12 mg/dL (ref 5–40)

## 2021-08-07 LAB — CMP14+EGFR
ALT: 136 IU/L — ABNORMAL HIGH (ref 0–32)
AST: 94 IU/L — ABNORMAL HIGH (ref 0–40)
Albumin/Globulin Ratio: 2 (ref 1.2–2.2)
Albumin: 4.5 g/dL (ref 3.8–4.8)
Alkaline Phosphatase: 83 IU/L (ref 44–121)
BUN/Creatinine Ratio: 11 (ref 9–23)
BUN: 7 mg/dL (ref 6–24)
Bilirubin Total: 0.5 mg/dL (ref 0.0–1.2)
CO2: 22 mmol/L (ref 20–29)
Calcium: 9.6 mg/dL (ref 8.7–10.2)
Chloride: 100 mmol/L (ref 96–106)
Creatinine, Ser: 0.64 mg/dL (ref 0.57–1.00)
Globulin, Total: 2.3 g/dL (ref 1.5–4.5)
Glucose: 142 mg/dL — ABNORMAL HIGH (ref 65–99)
Potassium: 4.1 mmol/L (ref 3.5–5.2)
Sodium: 137 mmol/L (ref 134–144)
Total Protein: 6.8 g/dL (ref 6.0–8.5)
eGFR: 111 mL/min/{1.73_m2} (ref >59)

## 2021-08-07 MED ORDER — METFORMIN HCL ER 500 MG PO TB24: 500.0000 mg | ORAL_TABLET | Freq: Two times a day (BID) | ORAL | 2 refills | Status: DC

## 2021-08-07 NOTE — Progress Notes (Signed)
Diet controlled diabetes.   Subjective:  Patient ID: [name], female    DOB: Apr 26, 1976  Age: 45 y.o. MRN: [id number]  CC: No chief complaint on file.   HPI [name] presents for not feeling good. Weak and tired. Not checking glucose. Had chest pains. CHEcked glucose and it was 344 on 8/11. Started at the gym, cur out bread, carbs. Drinking a lot of water. MEasuring glucose. It is now around 150 - 180 fasting. FEELING MUCH BETTER.  She notes that she has been told she was a diabetic several months ago and did not follow through until she started feeling so bad.  Depression screen [facility] 2/9 08/07/2021 04/17/2021 12/12/2020  Decreased Interest 0 0 0  Down, Depressed, Hopeless 0 0 0  PHQ - 2 Score 0 0 0  Altered sleeping 0 - -  Tired, decreased energy 1 - -  Change in appetite 1 - -  Feeling bad or failure about yourself  0 - -  Trouble concentrating 0 - -  Moving slowly or fidgety/restless 0 - -  Suicidal thoughts 0 - -  PHQ-9 Score 2 - -  Difficult doing work/chores Not difficult at all - -    History [name] has a past medical history of GERD (gastroesophageal reflux disease).   She has a past surgical history that includes Cholecystectomy; Foot surgery (Right); and bladder stent.   Her family history includes Breast cancer in her mother; Diabetes in her mother.She reports that she has never smoked. She has never used smokeless tobacco. She reports current alcohol use. She reports that she does not use drugs.    ROS Review of Systems  Constitutional:  Positive for fatigue.  HENT: Negative.    Eyes:  Negative for visual disturbance.  Respiratory:  Negative for shortness of breath.   Cardiovascular:  Negative for chest pain.  Gastrointestinal:  Negative for abdominal pain.  Musculoskeletal:  Negative for arthralgias.   Objective:  BP (!) 145/82   Pulse 62   Temp 97.7 F (36.5 C)   Ht _0  (1.702 m)   Wt 231 lb 12.8 oz (105.1 kg)   SpO2 98%    BMI 36.31 kg/m   BP Readings from Last 3 Encounters:  08/07/21 (!) 145/82  04/17/21 (!) 150/72  12/12/20 129/83    Wt Readings from Last 3 Encounters:  08/07/21 231 lb 12.8 oz (105.1 kg)  04/17/21 237 lb (107.5 kg)  12/12/20 241 lb 4 oz (109.4 kg)     Physical Exam Constitutional:      General: She is not in acute distress.    Appearance: She is well-developed.  Cardiovascular:     Rate and Rhythm: Normal rate and regular rhythm.  Pulmonary:     Breath sounds: Normal breath sounds.  Musculoskeletal:        General: Normal range of motion.  Skin:    General: Skin is warm and dry.  Neurological:     Mental Status: She is alert and oriented to person, place, and time.   Results for orders placed or performed in visit on 08/07/21  Bayer DCA Hb A1c Waived  Result Value Ref Range   HB A1C (BAYER DCA - WAIVED) 8.8 (H) <7.0 %      Assessment & Plan:   Diagnoses and all orders for this visit:  Uncontrolled type 2 diabetes mellitus with hyperglycemia ([place]) -     Bayer DCA Hb A1c Waived -     CBC with Differential/Platelet -  CMP14+EGFR -     Lipid panel  Other orders -     metFORMIN (GLUCOPHAGE-XR) 500 MG 24 hr tablet; Take 1 tablet (500 mg total) by mouth in the morning and at bedtime.      I am having [name] start on metFORMIN. I am also having her maintain her ibuprofen and cyclobenzaprine.  Allergies as of 08/07/2021       Reactions   Penicillins    Ivp Dye [iodinated Diagnostic Agents] Rash        Medication List        Accurate as of August 07, 2021  5:38 PM. If you have any questions, ask your nurse or doctor.          cyclobenzaprine 10 MG tablet Commonly known as: FLEXERIL Take 1 tablet (10 mg total) by mouth 3 (three) times daily as needed for muscle spasms.   ibuprofen 200 MG tablet Commonly known as: ADVIL Take 200 mg by mouth every 6 (six) hours as needed.   metFORMIN 500 MG 24 hr tablet Commonly known as:  GLUCOPHAGE-XR Take 1 tablet (500 mg total) by mouth in the morning and at bedtime. Started by: Megan Fraise, MD       On her own [name] has done a lot of research and discovered how diabetic is supposed to be.  I encouraged her in this area.  She also needs to be exercising regularly and looking at weight loss.  Additionally she was started on metformin today.  Follow-up: Return in about 1 month (around 09/07/2021).  [name], M.D.

## 2021-08-08 ENCOUNTER — Other Ambulatory Visit: Payer: Self-pay | Admitting: Family Medicine

## 2021-08-08 DIAGNOSIS — R748 Abnormal levels of other serum enzymes: Secondary | ICD-10-CM

## 2021-08-10 ENCOUNTER — Telehealth: Payer: Self-pay | Admitting: Family Medicine

## 2021-08-17 ENCOUNTER — Ambulatory Visit (HOSPITAL_COMMUNITY): Payer: BC Managed Care – PPO

## 2021-09-07 ENCOUNTER — Encounter: Payer: Self-pay | Admitting: Family Medicine

## 2021-09-07 ENCOUNTER — Ambulatory Visit: Payer: BC Managed Care – PPO | Admitting: Family Medicine

## 2021-11-05 ENCOUNTER — Other Ambulatory Visit: Payer: Self-pay | Admitting: Family Medicine

## 2021-11-13 ENCOUNTER — Encounter: Payer: BC Managed Care – PPO | Admitting: Family Medicine

## 2021-12-06 DIAGNOSIS — Z1231 Encounter for screening mammogram for malignant neoplasm of breast: Secondary | ICD-10-CM | POA: Diagnosis not present

## 2021-12-06 DIAGNOSIS — Z01419 Encounter for gynecological examination (general) (routine) without abnormal findings: Secondary | ICD-10-CM | POA: Diagnosis not present

## 2021-12-06 DIAGNOSIS — Z6832 Body mass index (BMI) 32.0-32.9, adult: Secondary | ICD-10-CM | POA: Diagnosis not present

## 2021-12-14 ENCOUNTER — Encounter: Payer: Self-pay | Admitting: Family Medicine

## 2021-12-14 ENCOUNTER — Other Ambulatory Visit: Payer: Self-pay

## 2021-12-14 ENCOUNTER — Ambulatory Visit (INDEPENDENT_AMBULATORY_CARE_PROVIDER_SITE_OTHER): Payer: BC Managed Care – PPO | Admitting: Family Medicine

## 2021-12-14 VITALS — BP 123/78 | HR 65 | Temp 97.7°F | Ht 67.0 in | Wt 208.6 lb

## 2021-12-14 DIAGNOSIS — Z0001 Encounter for general adult medical examination with abnormal findings: Secondary | ICD-10-CM

## 2021-12-14 DIAGNOSIS — Z0000 Encounter for general adult medical examination without abnormal findings: Secondary | ICD-10-CM

## 2021-12-14 DIAGNOSIS — E1165 Type 2 diabetes mellitus with hyperglycemia: Secondary | ICD-10-CM

## 2021-12-14 LAB — URINALYSIS
Bilirubin, UA: NEGATIVE
Glucose, UA: NEGATIVE
Ketones, UA: NEGATIVE
Leukocytes,UA: NEGATIVE
Nitrite, UA: NEGATIVE
Protein,UA: NEGATIVE
RBC, UA: NEGATIVE
Specific Gravity, UA: <1.005 — ABNORMAL LOW (ref 1.005–1.030)
Urobilinogen, Ur: 0.2 mg/dL (ref 0.2–1.0)
pH, UA: 6 (ref 5.0–7.5)

## 2021-12-14 LAB — BAYER DCA HB A1C WAIVED: HB A1C (BAYER DCA - WAIVED): 6 % — ABNORMAL HIGH (ref 4.8–5.6)

## 2021-12-14 NOTE — Progress Notes (Signed)
Subjective:  Patient ID: [name], female    DOB: [birth year]  Age: 45 y.o. MRN: [id number]  CC: Annual Exam   HPI [name] presents for presents for CPE & Follow-up of diabetes. Patient checks blood sugar at home.   100 fasting and 140 postprandial Patient denies symptoms such as polyuria, polydipsia, excessive hunger, nausea No significant hypoglycemic spells noted. Medications reviewed. Not taking the metformin.   Last eye appt was last week   Depression screen [facility] 2/9 12/14/2021 08/07/2021 04/17/2021  Decreased Interest 0 0 0  Down, Depressed, Hopeless 0 0 0  PHQ - 2 Score 0 0 0  Altered sleeping - 0 -  Tired, decreased energy - 1 -  Change in appetite - 1 -  Feeling bad or failure about yourself  - 0 -  Trouble concentrating - 0 -  Moving slowly or fidgety/restless - 0 -  Suicidal thoughts - 0 -  PHQ-9 Score - 2 -  Difficult doing work/chores - Not difficult at all -    History [name] has a past medical history of GERD (gastroesophageal reflux disease).   She has a past surgical history that includes Cholecystectomy; Foot surgery (Right); and bladder stent.   Her family history includes Breast cancer in her mother; Diabetes in her mother.She reports that she has never smoked. She has never used smokeless tobacco. She reports current alcohol use. She reports that she does not use drugs.    ROS Review of Systems  Constitutional:  Negative for appetite change, chills, diaphoresis, fatigue, fever and unexpected weight change.  HENT:  Negative for congestion, ear pain, hearing loss, postnasal drip, rhinorrhea, sneezing, sore throat and trouble swallowing.   Eyes:  Negative for pain.  Respiratory:  Negative for cough, chest tightness and shortness of breath.   Cardiovascular:  Negative for chest pain and palpitations.  Gastrointestinal:  Negative for abdominal pain, constipation, diarrhea, nausea and vomiting.  Endocrine: Negative for cold  intolerance, heat intolerance, polydipsia, polyphagia and polyuria.  Genitourinary:  Negative for dysuria, frequency and menstrual problem.  Musculoskeletal:  Negative for arthralgias and joint swelling.  Skin:  Negative for rash.  Allergic/Immunologic: Negative for environmental allergies.  Neurological:  Negative for dizziness, weakness, numbness and headaches.  Psychiatric/Behavioral:  Negative for agitation and dysphoric mood.    Objective:  BP 123/78    Pulse 65    Temp 97.7 F (36.5 C)    Ht 5' 7"  (1.702 m)    Wt 208 lb 9.6 oz (94.6 kg)    SpO2 99%    BMI 32.67 kg/m   BP Readings from Last 3 Encounters:  12/14/21 123/78  08/07/21 (!) 145/82  04/17/21 (!) 150/72    Wt Readings from Last 3 Encounters:  12/14/21 208 lb 9.6 oz (94.6 kg)  08/07/21 231 lb 12.8 oz (105.1 kg)  04/17/21 237 lb (107.5 kg)     Physical Exam Constitutional:      General: She is not in acute distress.    Appearance: She is well-developed.  HENT:     Head: Normocephalic and atraumatic.  Eyes:     Conjunctiva/sclera: Conjunctivae normal.     Pupils: Pupils are equal, round, and reactive to light.  Neck:     Thyroid: No thyromegaly.  Cardiovascular:     Rate and Rhythm: Normal rate and regular rhythm.     Heart sounds: Normal heart sounds. No murmur heard. Pulmonary:     Effort: Pulmonary effort is normal. No respiratory distress.  Breath sounds: Normal breath sounds. No wheezing or rales.  Abdominal:     General: Bowel sounds are normal. There is no distension.     Palpations: Abdomen is soft.     Tenderness: There is no abdominal tenderness.  Musculoskeletal:        General: Normal range of motion.     Cervical back: Normal range of motion and neck supple.  Lymphadenopathy:     Cervical: No cervical adenopathy.  Skin:    General: Skin is warm and dry.  Neurological:     Mental Status: She is alert and oriented to person, place, and time.  Psychiatric:        Behavior: Behavior  normal.        Thought Content: Thought content normal.        Judgment: Judgment normal.      Assessment & Plan:   [name] was seen today for annual exam.  Diagnoses and all orders for this visit:  Annual physical exam -     Bayer DCA Hb A1c Waived -     CBC with Differential/Platelet -     CMP14+EGFR -     Lipid panel -     Urinalysis  Uncontrolled type 2 diabetes mellitus with hyperglycemia (HCC) -     Bayer DCA Hb A1c Waived -     CBC with Differential/Platelet -     CMP14+EGFR -     Urinalysis       I have discontinued Wauneta M. Discher's cyclobenzaprine. I am also having her maintain her ibuprofen and metFORMIN.  Allergies as of 12/14/2021       Reactions   Penicillins    Ivp Dye [iodinated Contrast Media] Rash        Medication List        Accurate as of December 14, 2021  1:42 PM. If you have any questions, ask your nurse or doctor.          STOP taking these medications    cyclobenzaprine 10 MG tablet Commonly known as: FLEXERIL Stopped by: Claretta Fraise, MD       TAKE these medications    ibuprofen 200 MG tablet Commonly known as: ADVIL Take 200 mg by mouth every 6 (six) hours as needed.   metFORMIN 500 MG 24 hr tablet Commonly known as: GLUCOPHAGE-XR TAKE 1 TABLET (500 MG TOTAL) BY MOUTH IN THE MORNING AND AT BEDTIME.         Follow-up: Return in about 3 months (around 03/14/2022) for diabetes.  Claretta Fraise, M.D.

## 2021-12-15 LAB — CMP14+EGFR
ALT: 38 IU/L — ABNORMAL HIGH (ref 0–32)
AST: 34 IU/L (ref 0–40)
Albumin/Globulin Ratio: 2.1 (ref 1.2–2.2)
Albumin: 4.6 g/dL (ref 3.8–4.8)
Alkaline Phosphatase: 80 IU/L (ref 44–121)
BUN/Creatinine Ratio: 18 (ref 9–23)
BUN: 10 mg/dL (ref 6–24)
Bilirubin Total: 0.5 mg/dL (ref 0.0–1.2)
CO2: 24 mmol/L (ref 20–29)
Calcium: 9.4 mg/dL (ref 8.7–10.2)
Chloride: 104 mmol/L (ref 96–106)
Creatinine, Ser: 0.57 mg/dL (ref 0.57–1.00)
Globulin, Total: 2.2 g/dL (ref 1.5–4.5)
Glucose: 132 mg/dL — ABNORMAL HIGH (ref 70–99)
Potassium: 4.4 mmol/L (ref 3.5–5.2)
Sodium: 139 mmol/L (ref 134–144)
Total Protein: 6.8 g/dL (ref 6.0–8.5)
eGFR: 113 mL/min/{1.73_m2} (ref >59)

## 2021-12-15 LAB — CBC WITH DIFFERENTIAL/PLATELET
Basophils Absolute: 0 10*3/uL (ref 0.0–0.2)
Basos: 1 %
EOS (ABSOLUTE): 0.1 10*3/uL (ref 0.0–0.4)
Eos: 1 %
Hematocrit: 43.5 % (ref 34.0–46.6)
Hemoglobin: 14.2 g/dL (ref 11.1–15.9)
Immature Grans (Abs): 0 10*3/uL (ref 0.0–0.1)
Immature Granulocytes: 1 %
Lymphocytes Absolute: 1.9 10*3/uL (ref 0.7–3.1)
Lymphs: 27 %
MCH: 28.2 pg (ref 26.6–33.0)
MCHC: 32.6 g/dL (ref 31.5–35.7)
MCV: 87 fL (ref 79–97)
Monocytes Absolute: 0.5 10*3/uL (ref 0.1–0.9)
Monocytes: 8 %
Neutrophils Absolute: 4.4 10*3/uL (ref 1.4–7.0)
Neutrophils: 62 %
Platelets: 221 10*3/uL (ref 150–450)
RBC: 5.03 x10E6/uL (ref 3.77–5.28)
RDW: 13.7 % (ref 11.7–15.4)
WBC: 7 10*3/uL (ref 3.4–10.8)

## 2021-12-15 LAB — LIPID PANEL
Chol/HDL Ratio: 2.6 ratio (ref 0.0–4.4)
Cholesterol, Total: 151 mg/dL (ref 100–199)
HDL: 57 mg/dL (ref >39)
LDL Chol Calc (NIH): 84 mg/dL (ref 0–99)
Triglycerides: 48 mg/dL (ref 0–149)
VLDL Cholesterol Cal: 10 mg/dL (ref 5–40)

## 2021-12-21 ENCOUNTER — Telehealth: Payer: Self-pay | Admitting: Family Medicine

## 2021-12-21 NOTE — Telephone Encounter (Signed)
Reviewed labs with pt and answered all questions.

## 2021-12-21 NOTE — Progress Notes (Signed)
Hello Jerae,  Your lab result is normal and/or stable.Some minor variations that are not significant are commonly marked abnormal, but do not represent any medical problem for you.  Best regards, Corneluis Allston, M.D.

## 2021-12-21 NOTE — Telephone Encounter (Signed)
Pt aware of normal results but would like to discuss labs. Please call back.

## 2022-01-24 ENCOUNTER — Encounter: Payer: Self-pay | Admitting: *Deleted

## 2022-03-13 ENCOUNTER — Ambulatory Visit: Payer: BC Managed Care – PPO | Admitting: Family Medicine

## 2022-03-15 ENCOUNTER — Ambulatory Visit: Payer: BC Managed Care – PPO | Admitting: Family Medicine

## 2022-04-16 IMAGING — MG MM BREAST LOCALIZATION CLIP
8 series · 8 of 24 positions shown · non-contrast
Comparison: Previous exam(s).

CLINICAL DATA: Status post ultrasound-guided core needle biopsy of
one of the recently demonstrated enlarged left inferior
retropectoral axillary lymph nodes.

EXAM:
DIAGNOSTIC LEFT MAMMOGRAM POST ULTRASOUND BIOPSY

[L ML synth-2D]
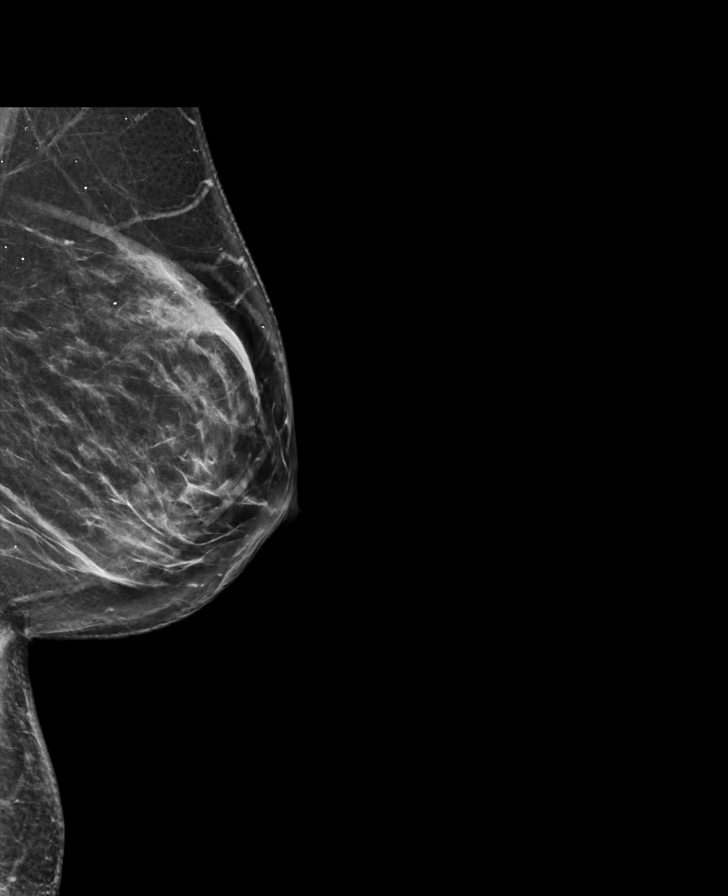

[L XCCL synth-2D]
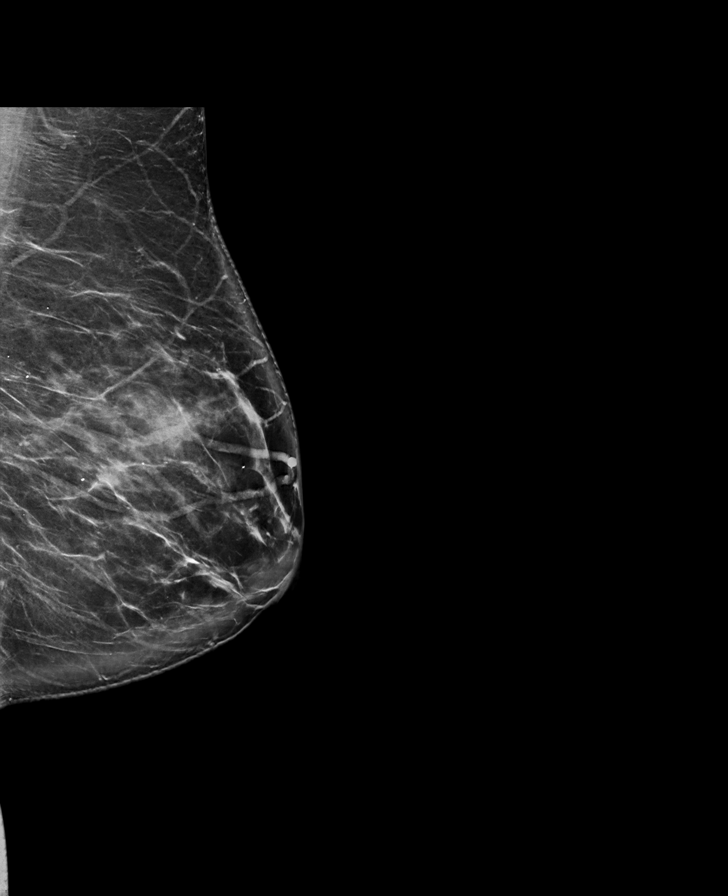

[L MLO synth-2D]
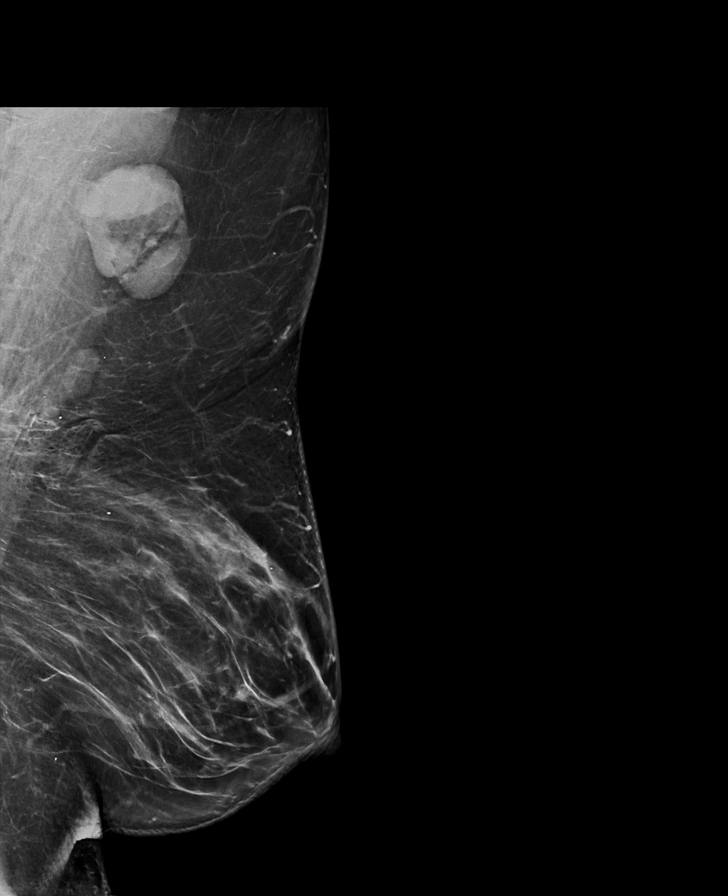

[L LMO synth-2D]
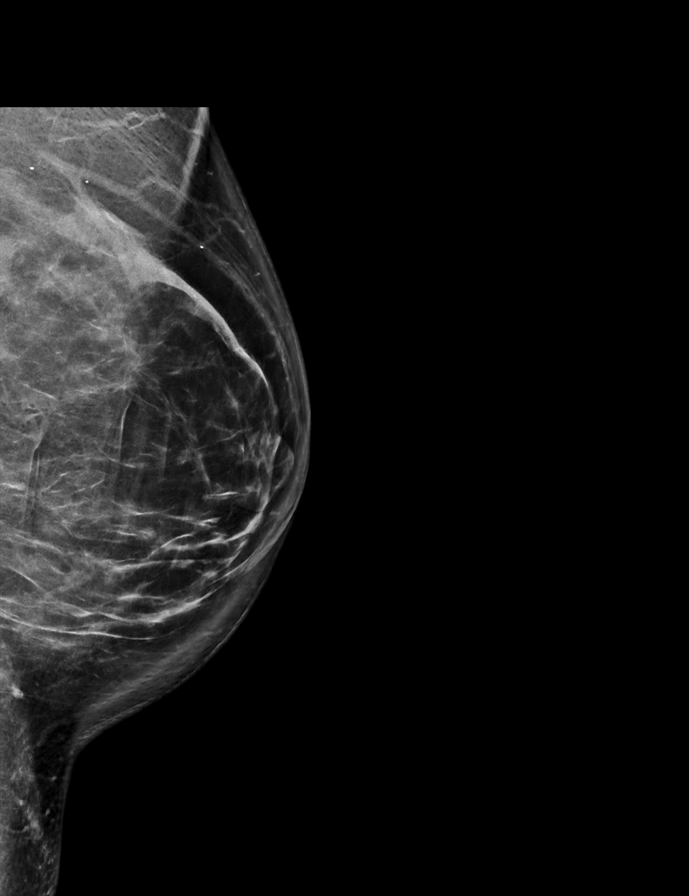

[L XCCL tomo · tomo slice 45/90.0]
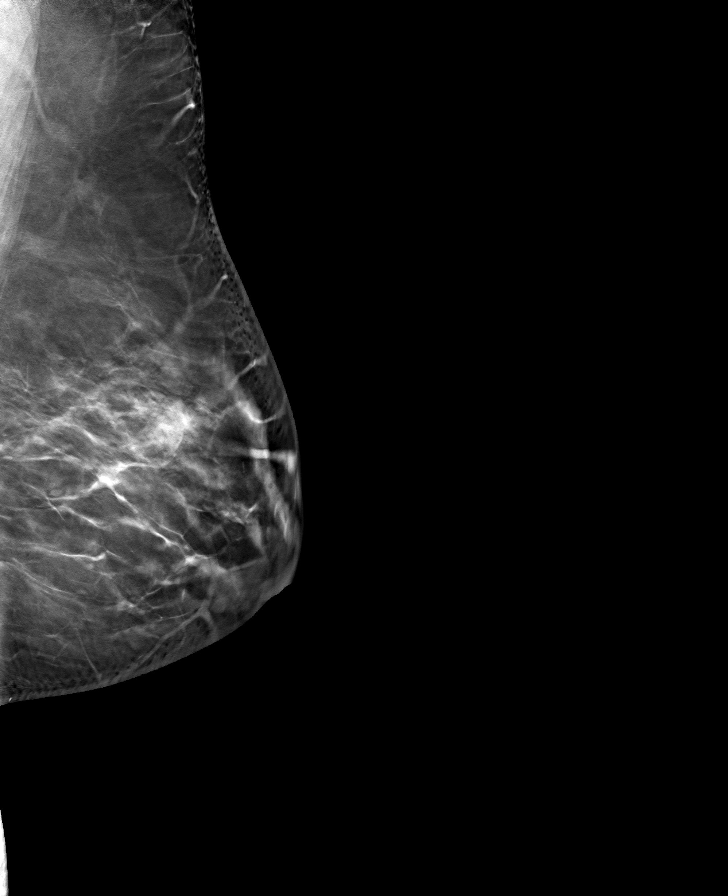

[L LMO BREAST TOMOSYNTHESIS IMAGE tomo · tomo slice 44/87.0]
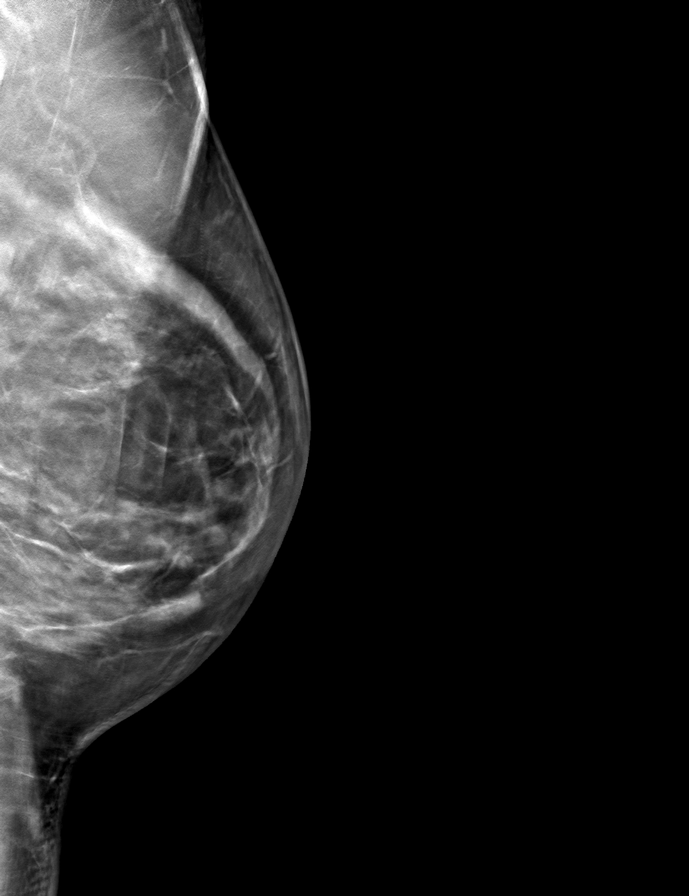

[L MLO tomo · tomo slice 45/88.0]
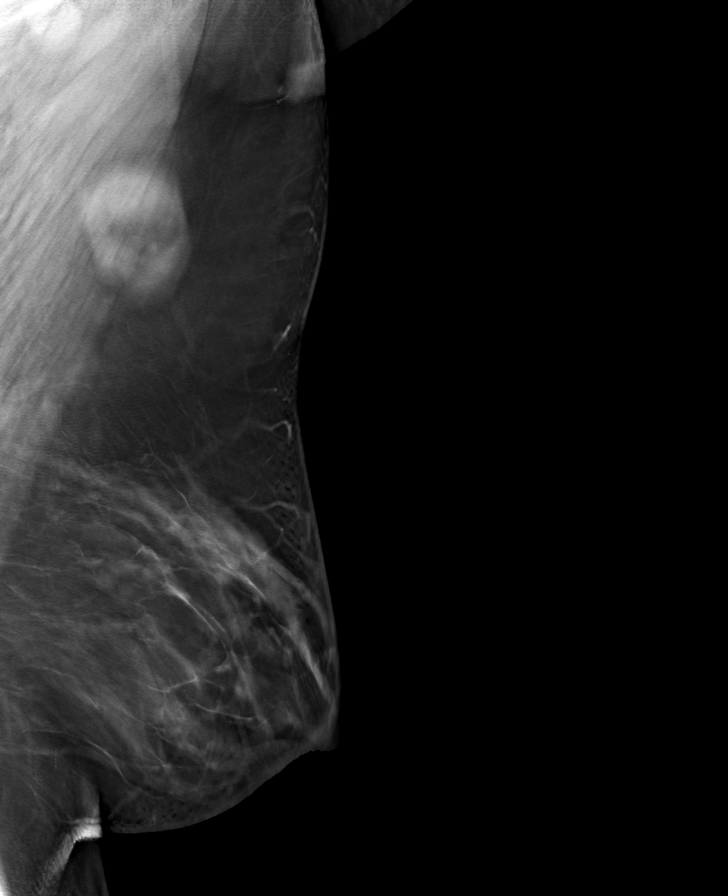

[L ML tomo · tomo slice 46/91.0]
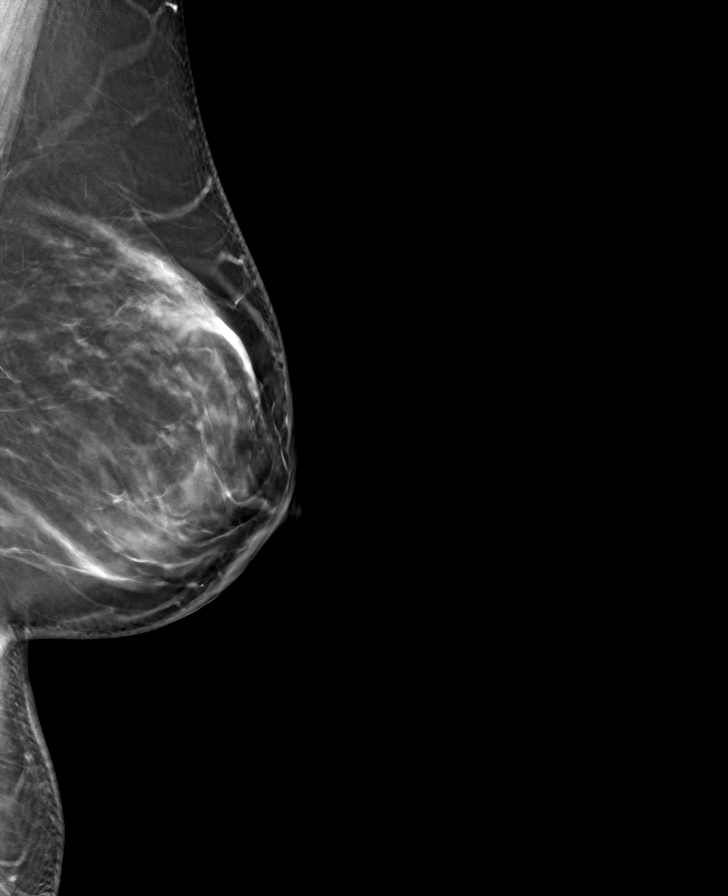

[8 of 24 positions shown; findings below may reference images not displayed]

FINDINGS: Mammographic images were obtained following ultrasound guided biopsy
of 1 of the recently demonstrated left inferior retropectoral
axillary lymph nodes. The biopsy marking clip could not be included
on the images due to its far posterior location.
IMPRESSION: Inability to include the biopsied lymph node containing the biopsy
marker clip due to its far posterior position.

Final Assessment: Post Procedure Mammograms for Marker Placement

## 2023-11-21 ENCOUNTER — Encounter: Payer: Self-pay | Admitting: Family Medicine

## 2023-11-21 ENCOUNTER — Ambulatory Visit (INDEPENDENT_AMBULATORY_CARE_PROVIDER_SITE_OTHER): Payer: BC Managed Care – PPO | Admitting: Family Medicine

## 2023-11-21 VITALS — BP 129/79 | HR 60 | Temp 97.2°F | Ht 67.0 in | Wt 230.8 lb

## 2023-11-21 DIAGNOSIS — E1165 Type 2 diabetes mellitus with hyperglycemia: Secondary | ICD-10-CM | POA: Diagnosis not present

## 2023-11-21 DIAGNOSIS — Z0000 Encounter for general adult medical examination without abnormal findings: Secondary | ICD-10-CM | POA: Diagnosis not present

## 2023-11-21 DIAGNOSIS — Z0001 Encounter for general adult medical examination with abnormal findings: Secondary | ICD-10-CM

## 2023-11-21 LAB — BAYER DCA HB A1C WAIVED: HB A1C (BAYER DCA - WAIVED): 6.8 % — ABNORMAL HIGH (ref 4.8–5.6)

## 2023-11-21 NOTE — Progress Notes (Signed)
Subjective:  Patient ID: [name], female    DOB: September 26, 1976  Age: 47 y.o. MRN: [id number]  CC: Annual Exam   HPI [name] presents for CPE, no GYN.     11/21/2023    1:01 PM 12/14/2021    1:05 PM 08/07/2021   11:00 AM  Depression screen PHQ 2/9  Decreased Interest 0 0 0  Down, Depressed, Hopeless 0 0 0  PHQ - 2 Score 0 0 0  Altered sleeping 0  0  Tired, decreased energy 0  1  Change in appetite 0  1  Feeling bad or failure about yourself  0  0  Trouble concentrating 0  0  Moving slowly or fidgety/restless 0  0  Suicidal thoughts 0  0  PHQ-9 Score 0  2  Difficult doing work/chores Not difficult at all  Not difficult at all    History [name] has a past medical history of GERD (gastroesophageal reflux disease).   [name] has a past surgical history that includes Cholecystectomy; Foot surgery (Right); and bladder stent.   [name] family history includes Breast cancer in [name] mother; Diabetes in [name] mother.[name] reports that [name] has never smoked. [name] has never used smokeless tobacco. [name] reports current alcohol use. [name] reports that [name] does not use drugs.    ROS Review of Systems  Constitutional: Negative.   HENT: Negative.    Eyes:  Negative for visual disturbance.  Respiratory:  Negative for shortness of breath.   Cardiovascular:  Negative for chest pain.  Gastrointestinal:  Negative for abdominal pain.  Musculoskeletal:  Negative for arthralgias.    Objective:  BP 129/79   Pulse 60   Temp (!) 97.2 F (36.2 C) (Temporal)   Ht 5\' 7"  (1.702 m)   Wt 230 lb 12.8 oz (104.7 kg) Comment: pt wearing steel toe shoes  SpO2 100%   BMI 36.15 kg/m   BP Readings from Last 3 Encounters:  11/21/23 129/79  12/14/21 123/78  08/07/21 (!) 145/82    Wt Readings from Last 3 Encounters:  11/21/23 230 lb 12.8 oz (104.7 kg)  12/14/21 208 lb 9.6 oz (94.6 kg)  08/07/21 231 lb 12.8 oz (105.1 kg)     Physical Exam Constitutional:      General: [name] is not  in acute distress.    Appearance: [name] is well-developed.  HENT:     Head: Normocephalic and atraumatic.  Eyes:     Conjunctiva/sclera: Conjunctivae normal.     Pupils: Pupils are equal, round, and reactive to light.  Neck:     Thyroid: No thyromegaly.  Cardiovascular:     Rate and Rhythm: Normal rate and regular rhythm.     Heart sounds: Normal heart sounds. No murmur heard. Pulmonary:     Effort: Pulmonary effort is normal. No respiratory distress.     Breath sounds: Normal breath sounds. No wheezing or rales.  Abdominal:     General: Bowel sounds are normal. There is no distension.     Palpations: Abdomen is soft.     Tenderness: There is no abdominal tenderness.  Musculoskeletal:        General: Normal range of motion.     Cervical back: Normal range of motion and neck supple.  Lymphadenopathy:     Cervical: No cervical adenopathy.  Skin:    General: Skin is warm and dry.  Neurological:     Mental Status: [name] is alert and oriented to person, place, and time.  Psychiatric:  Behavior: Behavior normal.        Thought Content: Thought content normal.        Judgment: Judgment normal.       Assessment & Plan:   [name] was seen today for annual exam.  Diagnoses and all orders for this visit:  Annual physical exam -     CBC with Differential/Platelet -     CMP14+EGFR -     Lipid panel -     Microalbumin / creatinine urine ratio -     Bayer DCA Hb A1c Waived -     TSH + free T4  Uncontrolled type 2 diabetes mellitus with hyperglycemia (HCC) -     Microalbumin / creatinine urine ratio -     Bayer DCA Hb A1c Waived       I have discontinued Tenee M. Balfour's metFORMIN. I am also having [name] maintain [name] ibuprofen.  Allergies as of 11/21/2023       Reactions   Penicillins    Ivp Dye [iodinated Contrast Media] Rash        Medication List        Accurate as of November 21, 2023  1:32 PM. If you have any questions, ask your nurse or doctor.           STOP taking these medications    metFORMIN 500 MG 24 hr tablet Commonly known as: GLUCOPHAGE-XR Stopped by: Charlottie Peragine       TAKE these medications    ibuprofen 200 MG tablet Commonly known as: ADVIL Take 200 mg by mouth every 6 (six) hours as needed.         Follow-up: No follow-ups on file.  Mechele Claude, M.D.

## 2023-11-22 LAB — CMP14+EGFR
ALT: 35 [IU]/L — ABNORMAL HIGH (ref 0–32)
AST: 26 [IU]/L (ref 0–40)
Albumin: 4.5 g/dL (ref 3.9–4.9)
Alkaline Phosphatase: 66 [IU]/L (ref 44–121)
BUN/Creatinine Ratio: 15 (ref 9–23)
BUN: 9 mg/dL (ref 6–24)
Bilirubin Total: 0.6 mg/dL (ref 0.0–1.2)
CO2: 23 mmol/L (ref 20–29)
Calcium: 9 mg/dL (ref 8.7–10.2)
Chloride: 101 mmol/L (ref 96–106)
Creatinine, Ser: 0.6 mg/dL (ref 0.57–1.00)
Globulin, Total: 2.4 g/dL (ref 1.5–4.5)
Glucose: 153 mg/dL — ABNORMAL HIGH (ref 70–99)
Potassium: 3.8 mmol/L (ref 3.5–5.2)
Sodium: 138 mmol/L (ref 134–144)
Total Protein: 6.9 g/dL (ref 6.0–8.5)
eGFR: 111 mL/min/{1.73_m2} (ref >59)

## 2023-11-22 LAB — TSH+FREE T4
Free T4: 1.06 ng/dL (ref 0.82–1.77)
TSH: 1.3 u[IU]/mL (ref 0.450–4.500)

## 2023-11-22 LAB — CBC WITH DIFFERENTIAL/PLATELET
Basophils Absolute: 0 10*3/uL (ref 0.0–0.2)
Basos: 1 %
EOS (ABSOLUTE): 0.1 10*3/uL (ref 0.0–0.4)
Eos: 1 %
Hematocrit: 43.8 % (ref 34.0–46.6)
Hemoglobin: 14.3 g/dL (ref 11.1–15.9)
Immature Grans (Abs): 0.1 10*3/uL (ref 0.0–0.1)
Immature Granulocytes: 1 %
Lymphocytes Absolute: 2.2 10*3/uL (ref 0.7–3.1)
Lymphs: 29 %
MCH: 29.7 pg (ref 26.6–33.0)
MCHC: 32.6 g/dL (ref 31.5–35.7)
MCV: 91 fL (ref 79–97)
Monocytes Absolute: 0.7 10*3/uL (ref 0.1–0.9)
Monocytes: 9 %
Neutrophils Absolute: 4.4 10*3/uL (ref 1.4–7.0)
Neutrophils: 59 %
Platelets: 186 10*3/uL (ref 150–450)
RBC: 4.82 x10E6/uL (ref 3.77–5.28)
RDW: 11.8 % (ref 11.7–15.4)
WBC: 7.4 10*3/uL (ref 3.4–10.8)

## 2023-11-22 LAB — LIPID PANEL
Chol/HDL Ratio: 2.7 {ratio} (ref 0.0–4.4)
Cholesterol, Total: 147 mg/dL (ref 100–199)
HDL: 54 mg/dL (ref >39)
LDL Chol Calc (NIH): 82 mg/dL (ref 0–99)
Triglycerides: 53 mg/dL (ref 0–149)
VLDL Cholesterol Cal: 11 mg/dL (ref 5–40)

## 2023-11-23 LAB — MICROALBUMIN / CREATININE URINE RATIO
Creatinine, Urine: 23 mg/dL
Microalb/Creat Ratio: <13 mg/g{creat} (ref 0–29)
Microalbumin, Urine: <3 ug/mL

## 2023-11-24 NOTE — Progress Notes (Signed)
Hello Jerae,  Your lab result is normal and/or stable.Some minor variations that are not significant are commonly marked abnormal, but do not represent any medical problem for you.  Best regards, Corneluis Allston, M.D.

## 2023-12-06 DIAGNOSIS — Z1231 Encounter for screening mammogram for malignant neoplasm of breast: Secondary | ICD-10-CM | POA: Diagnosis not present

## 2023-12-06 DIAGNOSIS — Z124 Encounter for screening for malignant neoplasm of cervix: Secondary | ICD-10-CM | POA: Diagnosis not present

## 2023-12-06 DIAGNOSIS — Z01419 Encounter for gynecological examination (general) (routine) without abnormal findings: Secondary | ICD-10-CM | POA: Diagnosis not present

## 2023-12-06 DIAGNOSIS — Z1331 Encounter for screening for depression: Secondary | ICD-10-CM | POA: Diagnosis not present

## 2024-05-01 ENCOUNTER — Telehealth: Payer: Self-pay

## 2024-05-01 NOTE — Telephone Encounter (Signed)
 Lm on vm on 5/16 inquiring about colonoscopy. LS

## 2024-05-06 DIAGNOSIS — Z0000 Encounter for general adult medical examination without abnormal findings: Secondary | ICD-10-CM | POA: Diagnosis not present

## 2024-05-06 DIAGNOSIS — R9389 Abnormal findings on diagnostic imaging of other specified body structures: Secondary | ICD-10-CM | POA: Diagnosis not present

## 2024-05-06 DIAGNOSIS — N939 Abnormal uterine and vaginal bleeding, unspecified: Secondary | ICD-10-CM | POA: Diagnosis not present

## 2024-05-06 DIAGNOSIS — Z130 Encounter for screening for diseases of the blood and blood-forming organs and certain disorders involving the immune mechanism: Secondary | ICD-10-CM | POA: Diagnosis not present

## 2024-05-06 DIAGNOSIS — Z3202 Encounter for pregnancy test, result negative: Secondary | ICD-10-CM | POA: Diagnosis not present

## 2024-05-20 ENCOUNTER — Ambulatory Visit: Payer: BC Managed Care – PPO | Admitting: Family Medicine

## 2024-05-20 DIAGNOSIS — R9389 Abnormal findings on diagnostic imaging of other specified body structures: Secondary | ICD-10-CM | POA: Diagnosis not present

## 2024-05-20 DIAGNOSIS — N939 Abnormal uterine and vaginal bleeding, unspecified: Secondary | ICD-10-CM | POA: Diagnosis not present

## 2024-06-24 DIAGNOSIS — N938 Other specified abnormal uterine and vaginal bleeding: Secondary | ICD-10-CM | POA: Diagnosis not present

## 2024-06-24 DIAGNOSIS — N939 Abnormal uterine and vaginal bleeding, unspecified: Secondary | ICD-10-CM | POA: Diagnosis not present

## 2024-06-24 DIAGNOSIS — N925 Other specified irregular menstruation: Secondary | ICD-10-CM | POA: Diagnosis not present

## 2024-06-24 DIAGNOSIS — R9389 Abnormal findings on diagnostic imaging of other specified body structures: Secondary | ICD-10-CM | POA: Diagnosis not present

## 2024-08-03 DIAGNOSIS — N859 Noninflammatory disorder of uterus, unspecified: Secondary | ICD-10-CM | POA: Diagnosis not present

## 2024-08-03 DIAGNOSIS — N939 Abnormal uterine and vaginal bleeding, unspecified: Secondary | ICD-10-CM | POA: Diagnosis not present

## 2024-08-03 DIAGNOSIS — N840 Polyp of corpus uteri: Secondary | ICD-10-CM | POA: Diagnosis not present

## 2024-08-03 DIAGNOSIS — N8502 Endometrial intraepithelial neoplasia [EIN]: Secondary | ICD-10-CM | POA: Diagnosis not present

## 2024-08-03 HISTORY — PX: DILATION AND CURETTAGE OF UTERUS: SHX78

## 2024-08-10 DIAGNOSIS — N8502 Endometrial intraepithelial neoplasia [EIN]: Secondary | ICD-10-CM | POA: Diagnosis not present

## 2024-08-12 ENCOUNTER — Telehealth: Payer: Self-pay | Admitting: *Deleted

## 2024-08-12 NOTE — Telephone Encounter (Signed)
 Spoke with the patient regarding the referral to GYN oncology. Patient scheduled as new patient with Dr Viktoria 9/5 at 10:30 am. Patient given an arrival time of 10 am.  Explained to the patient the the doctor will perform a pelvic exam at this visit. Patient given the policy that only one visitor allowed and that visitor must be over 16 yrs are allowed in the Cancer Center. Patient given the address/phone number for the clinic and that the center offers free valet service. Patient aware that masks required.

## 2024-08-13 ENCOUNTER — Encounter: Payer: Self-pay | Admitting: Gynecologic Oncology

## 2024-08-13 ENCOUNTER — Telehealth: Payer: Self-pay | Admitting: *Deleted

## 2024-08-13 NOTE — H&P (View-Only) (Signed)
 GYNECOLOGIC ONCOLOGY NEW PATIENT CONSULTATION   Patient Name: [name]  Patient Age: 48 y.o. Date of Service: 08/14/24 Referring Provider: Burnard Bowers, MD  Primary Care Provider: Zollie Lowers, MD Consulting Provider: Comer Dollar, MD   Assessment/Plan:  Premenopausal patient with at least EIN.   We reviewed the diagnosis of endometrial intraepithelial neoplasia (EIN) and the treatment options, including medical management (Mirena IUD or progesterone PO) or hysterectomy.     We discussed that the standard of care for a patient with EIN who is a suitable candidate for surgery is total hysterectomy.  The patient is a suitable candidate for hysterectomy via a minimally invasive approach to surgery.  Given age and premenopausal status, we discussed the pros and cons of concurrent bilateral salpingo-oophorectomy.  Given proximity to menopause, I recommended at least consideration of concurrent removal of her ovaries.  She would like to think about this and will let me know either prior to surgery or the morning of surgery.  If she would like to keep her ovaries, we will plan to send the uterus for frozen section at the time of surgery regardless of the below plan.  We reviewed that robotic assistance would be used to complete the surgery.  We discussed that endometrial cancer is detected in about 40% of final uterine pathology specimens from patients with EIN.  I reviewed 3 different options.  The first would be to send the uterus for intraoperative frozen pathology.  If cancer is identified at the time of surgery, additional procedures including lymph node evaluation, is recommended.  The second option would be to inject ICG and perform sentinel lymph node mapping but to still send the uterus for frozen section to guide the need for removal of sentinel lymph nodes.  The third option would be to proceed with sentinel lymph node mapping and biopsy without frozen section.  After  discussing the risks and benefits of each of these options, the patient would like to move forward with ICG injection and sentinel lymph node mapping but sending the uterus for frozen to determine need for sentinel lymph node removal.  We reviewed the sentinel lymph node technique. Risks and benefits of sentinel lymph node biopsy was reviewed. We reviewed the technique and ICG dye. The patient has a low risk allergy to iodinated contrast, she DOES NOT have known liver dysfunction. We reviewed the false negative rate (0.4%), and that 3% of patients with metastatic disease will not have it detected by SLN biopsy in endometrial cancer. A low risk of allergic reaction to the dye, <0.2% for ICG, has been reported. We also discussed that in the case of failed mapping, which occurs 40% of the time, a bilateral or unilateral lymphadenectomy will be performed at the surgeon's discretion.   Potential benefits of sentinel nodes including a higher detection rate for metastasis due to ultrastaging and potential reduction in operative morbidity. However, there remains uncertainty as to the role for treatment of micrometastatic disease. Further, the benefit of operative morbidity associated with the SLN technique in endometrial cancer is not yet completely known. In other patient populations (e.g. the cervical cancer population) there has been observed reductions in morbidity with SLN biopsy compared to pelvic lymphadenectomy. Lymphedema, nerve dysfunction and lymphocysts are all potential risks with the SLN technique as with complete lymphadenectomy. Additional risks to the patient include the risk of damage to an internal organ while operating in an altered view (e.g. the black and white image of the robotic fluorescence imaging mode).  In terms of additional treatment, we reviewed the role of progesterone therapy and the effect on preneoplastic lesions, believed to include induction of apoptosis in addition to tissue  sloughing during withdrawal bleeding.  Activation of the progesterone receptors is believed to lead stromal decidualization and thinning of the lining.   The patient would like to move forward with definitive surgery. We discussed the plan for a robotic assisted hysterectomy, bilateral salpingectomy, possible oophorectomy, sentinel lymph node mapping, possible sentinel lymph node biopsy, possible lymph node dissection, possible laparotomy. The risks of surgery were discussed in detail and she understands these to include infection; wound separation; hernia; vaginal cuff separation, injury to adjacent organs such as bowel, bladder, blood vessels, ureters and nerves; bleeding which may require blood transfusion; anesthesia risk; thromboembolic events; possible death; unforeseen complications; possible need for re-exploration; medical complications such as heart attack, stroke, pleural effusion and pneumonia; and, if full lymphadenectomy is performed the risk of lymphedema and lymphocyst. The patient will receive DVT and antibiotic prophylaxis as indicated. She voiced a clear understanding. She had the opportunity to ask questions. Perioperative instructions were reviewed with her. Prescriptions for post-op medications were sent to her pharmacy of choice.   Preoperative clearance will be requested from her primary care provider.     Recommended that she continue Megace until the date of her surgery.  Given duration and quantity of bleeding, plan for CBC today.  A copy of this note was sent to the patient's referring provider.   80 minutes of total time was spent for this patient encounter, including preparation, face-to-face counseling with the patient and coordination of care, and documentation of the encounter.  Comer Dollar, MD  Division of Gynecologic Oncology  Department of Obstetrics and Gynecology  [facility]  ___________________________________________  Chief  Complaint: Chief Complaint  Patient presents with   EIN    History of Present Illness:  [name] is a 48 y.o. y.o. female who is seen in consultation at the request of Dr. Fogleman for an evaluation of EIN.  The patient initially presented with abnormal uterine bleeding at the end of 2024.  Withdrawal bleed was induced with the use of Provera in May. 05/06/2024: Pelvic ultrasound exam at [facility] shows a uterus measuring 9.6 x 6.8 x 5.8 cm with a thickened endometrium measuring 17 mm.  Bilateral ovaries normal in appearance without masses.  No free fluid. 05/06/2024: Endometrial biopsy reveals menstrual endometrium. 06/24/2024: Endometrial biopsy with secretory exhausted glands and pseudo decidual response consistent with progestin administration.  No hyperplasia, carcinoma, or endometritis. Patient with continued bleeding on 15 mg of Aygestin despite 2 biopsies negative for hyperplasia or malignancy. 08/03/2024: Hysteroscopy with endometrial sampling performed.  On hysteroscopy, endometrium noted to be thick and vascular.  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  Patient comes in with her husband today.  She notes normal menses until April of this year when she began having heavy bleeding with passage of quarter size or bigger clots.  On her heaviest days, she would bleed through a tampon and 2 pads within an hour or less.  She had a similar episode of heavy bleeding about 4 5 years ago that responded to short course of progesterone.  Earlier this year, she took 5 days of progesterone and then subsequently 10 days of progesterone without any significant change to her bleeding.  She ultimately was started on 80 mg daily of Megace after  her most recent biopsy resulted with significant decrease in bleeding, only 2 episodes of spotting since starting this medication.  She notes intermittent pelvic pain since April for which  she has been using Tylenol  and ibuprofen as needed.  She notes decreased energy for the last 2 weeks, feeling somewhat tired.  Has constipation at baseline, uses MiraLAX as needed.  Denies any urinary symptoms.  Appetite has been up and down.  Notes some queasiness with Megace, denies vomiting.  Has also noticed some mental fog since starting the Megace.  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  Age at menarche: 48  Hx of STDs: denies Last pap: 11/2023 - NILM, HR HPV negative History of abnormal pap smears: denies  SCREENING STUDIES:  Last mammogram: 2024  Last colonoscopy: denies  MEDICATIONS: Outpatient Encounter Medications as of 08/14/2024  Medication Sig   acetaminophen  (TYLENOL ) 325 MG tablet Take 650 mg by mouth every 6 (six) hours as needed.   megestrol (MEGACE) 40 MG tablet Take 40 mg by mouth 2 (two) times daily.   senna-docusate (SENOKOT-S) 8.6-50 MG tablet Take 2 tablets by mouth at bedtime. For AFTER surgery, do not take if having diarrhea   ibuprofen (ADVIL) 200 MG tablet Take 200 mg by mouth every 6 (six) hours as needed.   No facility-administered encounter medications on file as of 08/14/2024.    ALLERGIES:  Allergies  Allergen Reactions   Penicillins     Patient reports the  reaction happened when she as a baby.  The doctor said not to take PCN again - it would be deadly.   Ivp Dye [Iodinated Contrast Media] Rash     FAMILY HISTORY:  Family History  Problem Relation Age of Onset   Lung cancer Mother    Breast cancer Mother    Diabetes Mother    Lung cancer Maternal Grandmother    Colon cancer Neg Hx    Esophageal cancer Neg Hx    Stomach cancer Neg Hx    Liver disease Neg Hx    Endometrial cancer Neg Hx    Ovarian cancer Neg Hx    Prostate cancer Neg Hx      SOCIAL HISTORY:  Social Connections: Not on file    REVIEW OF SYSTEMS:  + Pelvic pain, dyspareunia, menstrual problems, vaginal bleeding, headache, anxiety Denies appetite changes, fevers, chills, fatigue, unexplained weight changes. Denies hearing loss, neck lumps or masses, mouth sores, ringing in ears or voice changes. Denies cough or wheezing.  Denies shortness of breath. Denies  chest pain or palpitations. Denies leg swelling. Denies abdominal distention, pain, blood in stools, constipation, diarrhea, nausea, vomiting, or early satiety. Denies dysuria, frequency, hematuria or incontinence. Denies hot flashes.   Denies joint pain, back pain or muscle pain/cramps. Denies itching, rash, or wounds. Denies dizziness, numbness or seizures. Denies swollen lymph nodes or glands, denies easy bruising or bleeding. Denies depression, confusion, or decreased concentration.  Physical Exam:  Vital Signs for this encounter:  Blood pressure 132/68, pulse 69, temperature 98.1 F (36.7 C), temperature source Oral, resp. rate 20, height 5' 8 (1.727 m), weight 230 lb 12.8 oz (104.7 kg), SpO2 100%. Body mass index is 35.09 kg/m. General: Alert, oriented, no acute distress.  HEENT: Normocephalic, atraumatic. Sclera anicteric.  Chest: Clear to auscultation bilaterally. No wheezes, rhonchi, or rales. Cardiovascular: Regular rate and rhythm, no murmurs, rubs, or gallops.  Abdomen: Obese. Normoactive  bowel sounds. Soft, nondistended, nontender to palpation. No masses or hepatosplenomegaly appreciated. No palpable fluid wave.  Extremities: Grossly normal range of motion. Warm, well perfused. No edema bilaterally.  Skin: No rashes or lesions.  Lymphatics: No cervical, supraclavicular, or inguinal adenopathy.  GU:  Normal external female genitalia. No lesions. No discharge or bleeding.             Bladder/urethra:  No lesions or masses, well supported bladder             Vagina: Well-rugated.             Cervix: Normal appearing, no lesions.  Some nabothian cyst apparent.             Uterus: Small, mobile, no parametrial involvement or nodularity.             Adnexa: No masses appreciated.  Rectal: Deferred.  LABORATORY AND RADIOLOGIC DATA:  Outside medical records were reviewed to synthesize the above history, along with the history and physical obtained during the visit.   Lab Results  Component Value Date   WBC 7.4 11/21/2023   HGB 14.3 11/21/2023   HCT 43.8 11/21/2023   PLT 186 11/21/2023   GLUCOSE 153 (H) 11/21/2023   CHOL 147 11/21/2023   TRIG 53 11/21/2023   HDL 54 11/21/2023   LDLCALC 82 11/21/2023   ALT 35 (H) 11/21/2023   AST 26 11/21/2023   NA 138 11/21/2023   K 3.8 11/21/2023   CL 101 11/21/2023   CREATININE 0.60 11/21/2023   BUN 9 11/21/2023   CO2 23 11/21/2023   TSH 1.300 11/21/2023   HGBA1C 6.8 (H) 11/21/2023

## 2024-08-13 NOTE — Progress Notes (Unsigned)
 GYNECOLOGIC ONCOLOGY NEW PATIENT CONSULTATION   Patient Name: [name]  Patient Age: 48 y.o. Date of Service: 08/14/24 Referring Provider: Burnard Bowers, MD  Primary Care Provider: Zollie Lowers, MD Consulting Provider: Comer Dollar, MD   Assessment/Plan:  Premenopausal patient with at least EIN.   We reviewed the diagnosis of endometrial intraepithelial neoplasia (EIN) and the treatment options, including medical management (Mirena IUD or progesterone PO) or hysterectomy.     We discussed that the standard of care for a patient with EIN who is a suitable candidate for surgery is total hysterectomy.  The patient is a suitable candidate for hysterectomy via a minimally invasive approach to surgery.  Given age and premenopausal status, we discussed the pros and cons of concurrent bilateral salpingo-oophorectomy.  Given proximity to menopause, I recommended at least consideration of concurrent removal of her ovaries.  She would like to think about this and will let me know either prior to surgery or the morning of surgery.  If she would like to keep her ovaries, we will plan to send the uterus for frozen section at the time of surgery regardless of the below plan.  We reviewed that robotic assistance would be used to complete the surgery.  We discussed that endometrial cancer is detected in about 40% of final uterine pathology specimens from patients with EIN.  I reviewed 3 different options.  The first would be to send the uterus for intraoperative frozen pathology.  If cancer is identified at the time of surgery, additional procedures including lymph node evaluation, is recommended.  The second option would be to inject ICG and perform sentinel lymph node mapping but to still send the uterus for frozen section to guide the need for removal of sentinel lymph nodes.  The third option would be to proceed with sentinel lymph node mapping and biopsy without frozen section.  After  discussing the risks and benefits of each of these options, the patient would like to move forward with ICG injection and sentinel lymph node mapping but sending the uterus for frozen to determine need for sentinel lymph node removal.  We reviewed the sentinel lymph node technique. Risks and benefits of sentinel lymph node biopsy was reviewed. We reviewed the technique and ICG dye. The patient has a low risk allergy to iodinated contrast, she DOES NOT have known liver dysfunction. We reviewed the false negative rate (0.4%), and that 3% of patients with metastatic disease will not have it detected by SLN biopsy in endometrial cancer. A low risk of allergic reaction to the dye, <0.2% for ICG, has been reported. We also discussed that in the case of failed mapping, which occurs 40% of the time, a bilateral or unilateral lymphadenectomy will be performed at the surgeon's discretion.   Potential benefits of sentinel nodes including a higher detection rate for metastasis due to ultrastaging and potential reduction in operative morbidity. However, there remains uncertainty as to the role for treatment of micrometastatic disease. Further, the benefit of operative morbidity associated with the SLN technique in endometrial cancer is not yet completely known. In other patient populations (e.g. the cervical cancer population) there has been observed reductions in morbidity with SLN biopsy compared to pelvic lymphadenectomy. Lymphedema, nerve dysfunction and lymphocysts are all potential risks with the SLN technique as with complete lymphadenectomy. Additional risks to the patient include the risk of damage to an internal organ while operating in an altered view (e.g. the black and white image of the robotic fluorescence imaging mode).  In terms of additional treatment, we reviewed the role of progesterone therapy and the effect on preneoplastic lesions, believed to include induction of apoptosis in addition to tissue  sloughing during withdrawal bleeding.  Activation of the progesterone receptors is believed to lead stromal decidualization and thinning of the lining.   The patient would like to move forward with definitive surgery. We discussed the plan for a robotic assisted hysterectomy, bilateral salpingectomy, possible oophorectomy, sentinel lymph node mapping, possible sentinel lymph node biopsy, possible lymph node dissection, possible laparotomy. The risks of surgery were discussed in detail and she understands these to include infection; wound separation; hernia; vaginal cuff separation, injury to adjacent organs such as bowel, bladder, blood vessels, ureters and nerves; bleeding which may require blood transfusion; anesthesia risk; thromboembolic events; possible death; unforeseen complications; possible need for re-exploration; medical complications such as heart attack, stroke, pleural effusion and pneumonia; and, if full lymphadenectomy is performed the risk of lymphedema and lymphocyst. The patient will receive DVT and antibiotic prophylaxis as indicated. She voiced a clear understanding. She had the opportunity to ask questions. Perioperative instructions were reviewed with her. Prescriptions for post-op medications were sent to her pharmacy of choice.   Preoperative clearance will be requested from her primary care provider.     Recommended that she continue Megace until the date of her surgery.  Given duration and quantity of bleeding, plan for CBC today.  A copy of this note was sent to the patient's referring provider.   80 minutes of total time was spent for this patient encounter, including preparation, face-to-face counseling with the patient and coordination of care, and documentation of the encounter.  Comer Dollar, MD  Division of Gynecologic Oncology  Department of Obstetrics and Gynecology  [facility]  ___________________________________________  Chief  Complaint: Chief Complaint  Patient presents with   EIN    History of Present Illness:  [name] is a 48 y.o. y.o. female who is seen in consultation at the request of Dr. Fogleman for an evaluation of EIN.  The patient initially presented with abnormal uterine bleeding at the end of 2024.  Withdrawal bleed was induced with the use of Provera in May. 05/06/2024: Pelvic ultrasound exam at [facility] shows a uterus measuring 9.6 x 6.8 x 5.8 cm with a thickened endometrium measuring 17 mm.  Bilateral ovaries normal in appearance without masses.  No free fluid. 05/06/2024: Endometrial biopsy reveals menstrual endometrium. 06/24/2024: Endometrial biopsy with secretory exhausted glands and pseudo decidual response consistent with progestin administration.  No hyperplasia, carcinoma, or endometritis. Patient with continued bleeding on 15 mg of Aygestin despite 2 biopsies negative for hyperplasia or malignancy. 08/03/2024: Hysteroscopy with endometrial sampling performed.  On hysteroscopy, endometrium noted to be thick and vascular.  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  Patient comes in with her husband today.  She notes normal menses until April of this year when she began having heavy bleeding with passage of quarter size or bigger clots.  On her heaviest days, she would bleed through a tampon and 2 pads within an hour or less.  She had a similar episode of heavy bleeding about 4 5 years ago that responded to short course of progesterone.  Earlier this year, she took 5 days of progesterone and then subsequently 10 days of progesterone without any significant change to her bleeding.  She ultimately was started on 80 mg daily of Megace after  her most recent biopsy resulted with significant decrease in bleeding, only 2 episodes of spotting since starting this medication.  She notes intermittent pelvic pain since April for which  she has been using Tylenol and ibuprofen as needed.  She notes decreased energy for the last 2 weeks, feeling somewhat tired.  Has constipation at baseline, uses MiraLAX as needed.  Denies any urinary symptoms.  Appetite has been up and down.  Notes some queasiness with Megace, denies vomiting.  Has also noticed some mental fog since starting the Megace.  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  Age at menarche: 23  Hx of STDs: denies Last pap: 11/2023 - NILM, HR HPV negative History of abnormal pap smears: denies  SCREENING STUDIES:  Last mammogram: 2024  Last colonoscopy: denies  MEDICATIONS: Outpatient Encounter Medications as of 08/14/2024  Medication Sig   acetaminophen (TYLENOL) 325 MG tablet Take 650 mg by mouth every 6 (six) hours as needed.   megestrol (MEGACE) 40 MG tablet Take 40 mg by mouth 2 (two) times daily.   senna-docusate (SENOKOT-S) 8.6-50 MG tablet Take 2 tablets by mouth at bedtime. For AFTER surgery, do not take if having diarrhea   ibuprofen (ADVIL) 200 MG tablet Take 200 mg by mouth every 6 (six) hours as needed.   No facility-administered encounter medications on file as of 08/14/2024.    ALLERGIES:  Allergies  Allergen Reactions   Penicillins     Patient reports the  reaction happened when she as a baby.  The doctor said not to take PCN again - it would be deadly.   Ivp Dye [Iodinated Contrast Media] Rash     FAMILY HISTORY:  Family History  Problem Relation Age of Onset   Lung cancer Mother    Breast cancer Mother    Diabetes Mother    Lung cancer Maternal Grandmother    Colon cancer Neg Hx    Esophageal cancer Neg Hx    Stomach cancer Neg Hx    Liver disease Neg Hx    Endometrial cancer Neg Hx    Ovarian cancer Neg Hx    Prostate cancer Neg Hx      SOCIAL HISTORY:  Social Connections: Not on file    REVIEW OF SYSTEMS:  + Pelvic pain, dyspareunia, menstrual problems, vaginal bleeding, headache, anxiety Denies appetite changes, fevers, chills, fatigue, unexplained weight changes. Denies hearing loss, neck lumps or masses, mouth sores, ringing in ears or voice changes. Denies cough or wheezing.  Denies shortness of breath. Denies  chest pain or palpitations. Denies leg swelling. Denies abdominal distention, pain, blood in stools, constipation, diarrhea, nausea, vomiting, or early satiety. Denies dysuria, frequency, hematuria or incontinence. Denies hot flashes.   Denies joint pain, back pain or muscle pain/cramps. Denies itching, rash, or wounds. Denies dizziness, numbness or seizures. Denies swollen lymph nodes or glands, denies easy bruising or bleeding. Denies depression, confusion, or decreased concentration.  Physical Exam:  Vital Signs for this encounter:  Blood pressure 132/68, pulse 69, temperature 98.1 F (36.7 C), temperature source Oral, resp. rate 20, height 5' 8 (1.727 m), weight 230 lb 12.8 oz (104.7 kg), SpO2 100%. Body mass index is 35.09 kg/m. General: Alert, oriented, no acute distress.  HEENT: Normocephalic, atraumatic. Sclera anicteric.  Chest: Clear to auscultation bilaterally. No wheezes, rhonchi, or rales. Cardiovascular: Regular rate and rhythm, no murmurs, rubs, or gallops.  Abdomen: Obese. Normoactive  bowel sounds. Soft, nondistended, nontender to palpation. No masses or hepatosplenomegaly appreciated. No palpable fluid wave.  Extremities: Grossly normal range of motion. Warm, well perfused. No edema bilaterally.  Skin: No rashes or lesions.  Lymphatics: No cervical, supraclavicular, or inguinal adenopathy.  GU:  Normal external female genitalia. No lesions. No discharge or bleeding.             Bladder/urethra:  No lesions or masses, well supported bladder             Vagina: Well-rugated.             Cervix: Normal appearing, no lesions.  Some nabothian cyst apparent.             Uterus: Small, mobile, no parametrial involvement or nodularity.             Adnexa: No masses appreciated.  Rectal: Deferred.  LABORATORY AND RADIOLOGIC DATA:  Outside medical records were reviewed to synthesize the above history, along with the history and physical obtained during the visit.   Lab Results  Component Value Date   WBC 7.4 11/21/2023   HGB 14.3 11/21/2023   HCT 43.8 11/21/2023   PLT 186 11/21/2023   GLUCOSE 153 (H) 11/21/2023   CHOL 147 11/21/2023   TRIG 53 11/21/2023   HDL 54 11/21/2023   LDLCALC 82 11/21/2023   ALT 35 (H) 11/21/2023   AST 26 11/21/2023   NA 138 11/21/2023   K 3.8 11/21/2023   CL 101 11/21/2023   CREATININE 0.60 11/21/2023   BUN 9 11/21/2023   CO2 23 11/21/2023   TSH 1.300 11/21/2023   HGBA1C 6.8 (H) 11/21/2023

## 2024-08-13 NOTE — Telephone Encounter (Signed)
 Spoke with the patient and moved appt from 9/5 to 8/29

## 2024-08-14 ENCOUNTER — Encounter: Payer: Self-pay | Admitting: Gynecologic Oncology

## 2024-08-14 ENCOUNTER — Inpatient Hospital Stay: Admitting: Gynecologic Oncology

## 2024-08-14 ENCOUNTER — Ambulatory Visit: Payer: Self-pay | Admitting: Gynecologic Oncology

## 2024-08-14 ENCOUNTER — Telehealth: Payer: Self-pay | Admitting: *Deleted

## 2024-08-14 ENCOUNTER — Other Ambulatory Visit: Payer: Self-pay | Admitting: Gynecologic Oncology

## 2024-08-14 ENCOUNTER — Inpatient Hospital Stay: Attending: Gynecologic Oncology | Admitting: Gynecologic Oncology

## 2024-08-14 ENCOUNTER — Inpatient Hospital Stay

## 2024-08-14 VITALS — BP 132/68 | HR 69 | Temp 98.1°F | Resp 20 | Ht 68.0 in | Wt 230.8 lb

## 2024-08-14 DIAGNOSIS — N8502 Endometrial intraepithelial neoplasia [EIN]: Secondary | ICD-10-CM

## 2024-08-14 DIAGNOSIS — N939 Abnormal uterine and vaginal bleeding, unspecified: Secondary | ICD-10-CM

## 2024-08-14 DIAGNOSIS — E669 Obesity, unspecified: Secondary | ICD-10-CM | POA: Diagnosis not present

## 2024-08-14 LAB — CBC (CANCER CENTER ONLY)
HCT: 42.3 % (ref 36.0–46.0)
Hemoglobin: 13.5 g/dL (ref 12.0–15.0)
MCH: 25 pg — ABNORMAL LOW (ref 26.0–34.0)
MCHC: 31.9 g/dL (ref 30.0–36.0)
MCV: 78.5 fL — ABNORMAL LOW (ref 80.0–100.0)
Platelet Count: 249 K/uL (ref 150–400)
RBC: 5.39 MIL/uL — ABNORMAL HIGH (ref 3.87–5.11)
RDW: 16.2 % — ABNORMAL HIGH (ref 11.5–15.5)
WBC Count: 7.7 K/uL (ref 4.0–10.5)
nRBC: 0 % (ref 0.0–0.2)

## 2024-08-14 MED ORDER — TRAMADOL HCL 50 MG PO TABS: 50.0000 mg | ORAL_TABLET | Freq: Four times a day (QID) | ORAL | 0 refills | Status: DC | PRN

## 2024-08-14 MED ORDER — SENNOSIDES-DOCUSATE SODIUM 8.6-50 MG PO TABS: 2.0000 | ORAL_TABLET | Freq: Every day | ORAL | 0 refills | Status: DC

## 2024-08-14 NOTE — Telephone Encounter (Signed)
 Per Dr Viktoria fax records and surgical optimization form to the PCP office  (Dr Zollie [phone])

## 2024-08-14 NOTE — Progress Notes (Signed)
 Patient here for a consult with Dr. Viktoria and for a pre-operative appointment prior to her scheduled surgery on 08/26/2024. She is scheduled for a robotic assisted total laparoscopic hysterectomy, bilateral salpingectomy, possible bilateral oophorectomy, sentinel lymph node injection, possible sentinel lymph node biopsy, possible lymph node dissection, possible laparotomy. The surgery was discussed in detail.  See after visit summary for additional details.     Discussed post-op pain management in detail including the aspects of the enhanced recovery pathway.  Advised her that a new prescription would be sent in for Tramadol  and it is only to be used for after her upcoming surgery.  We discussed the use of tylenol post-op and to monitor for a maximum of 4,000 mg in a 24 hour period.  Also prescribed sennakot to be used after surgery and to hold if having loose stools.  Discussed bowel regimen in detail.     Discussed the use of SCDs and measures to take at home to prevent DVT including frequent mobility.  Reportable signs and symptoms of DVT discussed. Post-operative instructions discussed and expectations for after surgery. Incisional care discussed as well including reportable signs and symptoms including erythema, drainage, wound separation.     30 minutes spent with the patient.  Verbalizing understanding of material discussed. No needs or concerns voiced at the end of the visit.   Advised patient to call for any needs.  Advised that her post-operative medications had been prescribed and could be picked up at any time.    This appointment is included in the global surgical bundle as pre-operative teaching and has no charge.

## 2024-08-14 NOTE — Patient Instructions (Addendum)
 We will plan on checking a CBC lab today to evaluate your blood level. You will be notified of the results.   Preparing for your Surgery  Plan for surgery on August 26, 2024 with Dr. Comer Dollar at [facility]. You will be scheduled for robotic assisted total laparoscopic hysterectomy (removal of the uterus and cervix), bilateral salpingectomy (removal of both fallopian tubes), possible bilateral oophorectomy (removal of both ovaries), sentinel lymph node injection, possible sentinel lymph node biopsy, possible lymph node dissection, possible laparotomy (larger incision on your abdomen if needed).  Pre-operative Testing -You will receive a phone call from presurgical testing at [facility] to arrange for a pre-operative appointment and lab work.  -Bring your insurance card, copy of an advanced directive if applicable, medication list  -At that visit, you will be asked to sign a consent for a possible blood transfusion in case a transfusion becomes necessary during surgery.  The need for a blood transfusion is rare but having consent is a necessary part of your care.     -You should not be taking blood thinners or aspirin at least ten days prior to surgery unless instructed by your surgeon.  -Do not take supplements such as fish oil (omega 3), red yeast rice, turmeric before your surgery. STOP TAKING AT LEAST 10 DAYS BEFORE SURGERY. You want to avoid medications with aspirin in them including headache powders such as BC or Goody's), Excedrin migraine.  -If you are taking a GLP-1 medication/injection such as Ozempic, Mounjaro, [id number], this needs to be held before surgery for at least 7 days before.  Day Before Surgery at Home -You will be asked to take in a light diet the day before surgery. You will be advised you can have clear liquids up until 3 hours before your surgery.    Eat a light diet the day before surgery.  Examples including soups, broths, toast, yogurt,  mashed potatoes.  AVOID GAS PRODUCING FOODS AND BEVERAGES. Things to avoid include carbonated beverages (fizzy beverages, sodas), raw fruits and raw vegetables (uncooked), or beans.   If your bowels are filled with gas, your surgeon will have difficulty visualizing your pelvic organs which increases your surgical risks.  Your role in recovery Your role is to become active as soon as directed by your doctor, while still giving yourself time to heal.  Rest when you feel tired. You will be asked to do the following in order to speed your recovery:  - Cough and breathe deeply. This helps to clear and expand your lungs and can prevent pneumonia after surgery.  - STAY ACTIVE WHEN YOU GET HOME. Do mild physical activity. Walking or moving your legs help your circulation and body functions return to normal. Do not try to get up or walk alone the first time after surgery.   -If you develop swelling on one leg or the other, pain in the back of your leg, redness/warmth in one of your legs, please call the office or go to the Emergency Room to have a doppler to rule out a blood clot. For shortness of breath, chest pain-seek care in the Emergency Room as soon as possible. - Actively manage your pain. Managing your pain lets you move in comfort. We will ask you to rate your pain on a scale of zero to 10. It is your responsibility to tell your doctor or nurse where and how much you hurt so your pain can be treated.  Special Considerations -If you are diabetic,  you may be placed on insulin after surgery to have closer control over your blood sugars to promote healing and recovery.  This does not mean that you will be discharged on insulin.  If applicable, your oral antidiabetics will be resumed when you are tolerating a solid diet.  -Your final pathology results from surgery should be available around one week after surgery and the results will be relayed to you when available.  -FMLA forms can be faxed to  [phone] and please allow 5-7 business days for completion.  Pain Management After Surgery -You will be prescribed your pain medication and bowel regimen medications before surgery so that you can have these available when you are discharged from the hospital. The pain medication is for use ONLY AFTER surgery and a new prescription will not be given.   -Make sure that you have Tylenol and Ibuprofen IF YOU ARE ABLE TO TAKE THESE MEDICATIONS at home to use on a regular basis after surgery for pain control. We recommend alternating the medications every hour to six hours since they work differently and are processed in the body differently for pain relief.  -Review the attached handout on narcotic use and their risks and side effects.   Bowel Regimen -You will be prescribed Sennakot-S to take nightly to prevent constipation especially if you are taking the narcotic pain medication intermittently.  It is important to prevent constipation and drink adequate amounts of liquids. You can stop taking this medication when you are not taking pain medication and you are back on your normal bowel routine.  Risks of Surgery Risks of surgery are low but include bleeding, infection, damage to surrounding structures, re-operation, blood clots, and very rarely death.   Blood Transfusion Information (For the consent to be signed before surgery)  We will be checking your blood type before surgery so in case of emergencies, we will know what type of blood you would need.                                            WHAT IS A BLOOD TRANSFUSION?  A transfusion is the replacement of blood or some of its parts. Blood is made up of multiple cells which provide different functions. Red blood cells carry oxygen and are used for blood loss replacement. White blood cells fight against infection. Platelets control bleeding. Plasma helps clot blood. Other blood products are available for specialized needs, such as  hemophilia or other clotting disorders. BEFORE THE TRANSFUSION  Who gives blood for transfusions?  You may be able to donate blood to be used at a later date on yourself (autologous donation). Relatives can be asked to donate blood. This is generally not any safer than if you have received blood from a stranger. The same precautions are taken to ensure safety when a relative's blood is donated. Healthy volunteers who are fully evaluated to make sure their blood is safe. This is blood bank blood. Transfusion therapy is the safest it has ever been in the practice of medicine. Before blood is taken from a donor, a complete history is taken to make sure that person has no history of diseases nor engages in risky social behavior (examples are intravenous drug use or sexual activity with multiple partners). The donor's travel history is screened to minimize risk of transmitting infections, such as malaria. The donated blood is tested for  signs of infectious diseases, such as HIV and hepatitis. The blood is then tested to be sure it is compatible with you in order to minimize the chance of a transfusion reaction. If you or a relative donates blood, this is often done in anticipation of surgery and is not appropriate for emergency situations. It takes many days to process the donated blood. RISKS AND COMPLICATIONS Although transfusion therapy is very safe and saves many lives, the main dangers of transfusion include:  Getting an infectious disease. Developing a transfusion reaction. This is an allergic reaction to something in the blood you were given. Every precaution is taken to prevent this. The decision to have a blood transfusion has been considered carefully by your caregiver before blood is given. Blood is not given unless the benefits outweigh the risks.  AFTER SURGERY INSTRUCTIONS  Return to work: 4-6 weeks if applicable  Activity: 1. Be up and out of the bed during the day.  Take a nap if needed.   You may walk up steps but be careful and use the hand rail.  Stair climbing will tire you more than you think, you may need to stop part way and rest.   2. No lifting or straining for 6 weeks over 10 pounds. No pushing, pulling, straining for 6 weeks.  3. No driving for 4-89 days when the following criteria have been met: Do not drive if you are taking narcotic pain medicine and make sure that your reaction time has returned.   4. You can shower as soon as the next day after surgery. Shower daily.  Use your regular soap and water (not directly on the incision) and pat your incision(s) dry afterwards; don't rub.  No tub baths or submerging your body in water until cleared by your surgeon. If you have the soap that was given to you by pre-surgical testing that was used before surgery, you do not need to use it afterwards because this can irritate your incisions.   5. No sexual activity and nothing in the vagina for 12 weeks.  6. You may experience a small amount of clear drainage from your incisions, which is normal.  If the drainage persists, increases, or changes color please call the office.  7. Do not use creams, lotions, or ointments such as neosporin on your incisions after surgery until advised by your surgeon because they can cause removal of the dermabond glue on your incisions.    8. You may experience vaginal spotting after surgery or when the stitches at the top of the vagina begin to dissolve.  The spotting is normal but if you experience heavy bleeding, call our office.  9. Take Tylenol or ibuprofen first for pain if you are able to take these medications and only use narcotic pain medication for severe pain not relieved by the Tylenol or Ibuprofen.  Monitor your Tylenol intake to a max of 4,000 mg in a 24 hour period. You can alternate these medications after surgery.  Diet: 1. Low sodium Heart Healthy Diet is recommended but you are cleared to resume your normal (before surgery)  diet after your procedure.  2. It is safe to use a laxative, such as Miralax or Colace, if you have difficulty moving your bowels before surgery. You have been prescribed Sennakot-S to take at bedtime every evening after surgery to keep bowel movements regular and to prevent constipation.    Wound Care: 1. Keep clean and dry.  Shower daily.  Reasons to call the  Doctor: Fever - Oral temperature greater than 100.4 degrees Fahrenheit Foul-smelling vaginal discharge Difficulty urinating Nausea and vomiting Increased pain at the site of the incision that is unrelieved with pain medicine. Difficulty breathing with or without chest pain New calf pain especially if only on one side Sudden, continuing increased vaginal bleeding with or without clots.   Contacts: For questions or concerns you should contact:  Dr. Comer Dollar at [phone]  [name], NP at [phone]  After Hours: call [phone] and have the GYN Oncologist paged/contacted (after 5 pm or on the weekends). You will speak with an after hours RN and let he or she know you have had surgery.  Messages sent via mychart are for non-urgent matters and are not responded to after hours so for urgent needs, please call the after hours number.

## 2024-08-18 ENCOUNTER — Ambulatory Visit: Admitting: Family Medicine

## 2024-08-19 ENCOUNTER — Telehealth: Payer: Self-pay

## 2024-08-19 NOTE — Telephone Encounter (Signed)
 I called patient regarding this and explained to her that we have not seen her since Dec 2024 so if she needs Dr Zollie to fill out surgery clearance form then she will need to make an appt with him to come in and have him clear her for surgery. Patient voiced understanding. She said another provider has actually already cleared her for surgery. She is going to call Oncology and see if they can send form to her OBGYN to fill out and if they say it has to be filled out by PCP then she will call us  back to make a surgery clearance appt.

## 2024-08-19 NOTE — Telephone Encounter (Signed)
 Copied from CRM [id number]. Topic: General - Other >> Aug 19, 2024 10:34 AM Sophia H wrote: Reason for CRM: GYN Oncology - faxed surgery clearance over last Friday and following up on it . Do not see anything in chart as of now. Please be on look out, patient has surgery next week on 09/10 and needs this clearance from PCP.   Confirmed Fax # [phone], they will refax

## 2024-08-19 NOTE — Telephone Encounter (Signed)
 Patient called back stating that her clearance form does need to be filled out by PCP. Patient made an appt to see PCP on 9/9 for clearance. Will put form in surgery clearance folder at nurse station B.

## 2024-08-20 ENCOUNTER — Encounter: Payer: Self-pay | Admitting: Obstetrics

## 2024-08-21 ENCOUNTER — Ambulatory Visit: Admitting: Gynecologic Oncology

## 2024-08-21 ENCOUNTER — Telehealth: Payer: Self-pay | Admitting: *Deleted

## 2024-08-21 NOTE — Patient Instructions (Addendum)
 SURGICAL WAITING ROOM VISITATION  Patients having surgery or a procedure may have no more than 2 support people in the waiting area - these visitors may rotate.    Children under the age of 28 must have an adult with them who is not the patient.  Visitors with respiratory illnesses are discouraged from visiting and should remain at home.  If the patient needs to stay at the hospital during part of their recovery, the visitor guidelines for inpatient rooms apply. Pre-op nurse will coordinate an appropriate time for 1 support person to accompany patient in pre-op.  This support person may not rotate.    Please refer to the [facility] website for the visitor guidelines for Inpatients (after your surgery is over and you are in a regular room).       Your procedure is scheduled on: 08/26/24   Report to [facility] Main Entrance    Report to admitting at 1:15 PM   Call this number if you have problems the morning of surgery [phone]   Do not eat food :After Midnight.   After Midnight you may have the following liquids until 12:30 PM DAY OF SURGERY  Water Non-Citrus Juices (without pulp, NO RED-Apple, White grape, White cranberry) Black Coffee (NO MILK/CREAM OR CREAMERS, sugar ok)  Clear Tea (NO MILK/CREAM OR CREAMERS, sugar ok) regular and decaf                             Plain Jell-O (NO RED)                                           Fruit ices (not with fruit pulp, NO RED)                                     Popsicles (NO RED)                                                               Sports drinks like Gatorade (NO RED)                 FOLLOW BOWEL PREP AND ANY ADDITIONAL PRE OP INSTRUCTIONS YOU RECEIVED FROM YOUR SURGEON'S OFFICE!!!     Oral Hygiene is also important to reduce your risk of infection.                                    Remember - BRUSH YOUR TEETH THE MORNING OF SURGERY WITH YOUR REGULAR TOOTHPASTE   Stop all vitamins and herbal supplements 7  days before surgery.   Take these medicines the morning of surgery with A SIP OF WATER: Megace, Tylenol if needed.             You may not have any metal on your body including hair pins, jewelry, and body piercing             Do not wear make-up, lotions, powders, perfumes/cologne, or deodorant  Do not  wear nail polish including gel and S&S, artificial/acrylic nails, or any other type of covering on natural nails including finger and toenails. If you have artificial nails, gel coating, etc. that needs to be removed by a nail salon please have this removed prior to surgery or surgery may need to be canceled/ delayed if the surgeon/ anesthesia feels like they are unable to be safely monitored.   Do not shave  48 hours prior to surgery.    Do not bring valuables to the hospital. [facility] IS NOT             RESPONSIBLE   FOR VALUABLES.   Contacts, glasses, dentures or bridgework may not be worn into surgery.  DO NOT BRING YOUR HOME MEDICATIONS TO THE HOSPITAL. PHARMACY WILL DISPENSE MEDICATIONS LISTED ON YOUR MEDICATION LIST TO YOU DURING YOUR ADMISSION IN THE HOSPITAL!    Patients discharged on the day of surgery will not be allowed to drive home.  Someone NEEDS to stay with you for the first 24 hours after anesthesia.   Special Instructions: Bring a copy of your healthcare power of attorney and living will documents the day of surgery if you haven't scanned them before.              Please read over the following fact sheets you were given: IF YOU HAVE QUESTIONS ABOUT YOUR PRE-OP INSTRUCTIONS PLEASE CALL [phone] [place]   If you received a COVID test during your pre-op visit  it is requested that you wear a mask when out in public, stay away from anyone that may not be feeling well and notify your surgeon if you develop symptoms. If you test positive for Covid or have been in contact with anyone that has tested positive in the last 10 days please notify you surgeon.    [place] -  Preparing for Surgery Before surgery, you can play an important role.  Because skin is not sterile, your skin needs to be as free of germs as possible.  You can reduce the number of germs on your skin by washing with CHG (chlorahexidine gluconate) soap before surgery.  CHG is an antiseptic cleaner which kills germs and bonds with the skin to continue killing germs even after washing. Please DO NOT use if you have an allergy to CHG or antibacterial soaps.  If your skin becomes reddened/irritated stop using the CHG and inform your nurse when you arrive at Short Stay. Do not shave (including legs and underarms) for at least 48 hours prior to the first CHG shower.  You may shave your face/neck.  Please follow these instructions carefully:  1.  Shower with CHG Soap the night before surgery and the  morning of surgery.  2.  If you choose to wash your hair, wash your hair first as usual with your normal  shampoo.  3.  After you shampoo, rinse your hair and body thoroughly to remove the shampoo.                             4.  Use CHG as you would any other liquid soap.  You can apply chg directly to the skin and wash.  Gently with a scrungie or clean washcloth.  5.  Apply the CHG Soap to your body ONLY FROM THE NECK DOWN.   Do   not use on face/ open  Wound or open sores. Avoid contact with eyes, ears mouth and   genitals (private parts).                       Wash face,  Genitals (private parts) with your normal soap.             6.  Wash thoroughly, paying special attention to the area where your    surgery  will be performed.  7.  Thoroughly rinse your body with warm water from the neck down.  8.  DO NOT shower/wash with your normal soap after using and rinsing off the CHG Soap.                9.  Pat yourself dry with a clean towel.            10.  Wear clean pajamas.            11.  Place clean sheets on your bed the night of your first shower and do not  sleep with  pets. Day of Surgery : Do not apply any lotions/deodorants the morning of surgery.  Please wear clean clothes to the hospital/surgery center.  FAILURE TO FOLLOW THESE INSTRUCTIONS MAY RESULT IN THE CANCELLATION OF YOUR SURGERY  ________________________________________________________________________ WHAT IS A BLOOD TRANSFUSION? Blood Transfusion Information  A transfusion is the replacement of blood or some of its parts. Blood is made up of multiple cells which provide different functions. Red blood cells carry oxygen and are used for blood loss replacement. White blood cells fight against infection. Platelets control bleeding. Plasma helps clot blood. Other blood products are available for specialized needs, such as hemophilia or other clotting disorders. BEFORE THE TRANSFUSION  Who gives blood for transfusions?  Healthy volunteers who are fully evaluated to make sure their blood is safe. This is blood bank blood. Transfusion therapy is the safest it has ever been in the practice of medicine. Before blood is taken from a donor, a complete history is taken to make sure that person has no history of diseases nor engages in risky social behavior (examples are intravenous drug use or sexual activity with multiple partners). The donor's travel history is screened to minimize risk of transmitting infections, such as malaria. The donated blood is tested for signs of infectious diseases, such as HIV and hepatitis. The blood is then tested to be sure it is compatible with you in order to minimize the chance of a transfusion reaction. If you or a relative donates blood, this is often done in anticipation of surgery and is not appropriate for emergency situations. It takes many days to process the donated blood. RISKS AND COMPLICATIONS Although transfusion therapy is very safe and saves many lives, the main dangers of transfusion include:  Getting an infectious disease. Developing a transfusion reaction.  This is an allergic reaction to something in the blood you were given. Every precaution is taken to prevent this. The decision to have a blood transfusion has been considered carefully by your caregiver before blood is given. Blood is not given unless the benefits outweigh the risks. AFTER THE TRANSFUSION Right after receiving a blood transfusion, you will usually feel much better and more energetic. This is especially true if your red blood cells have gotten low (anemic). The transfusion raises the level of the red blood cells which carry oxygen, and this usually causes an energy increase. The nurse administering the transfusion will monitor you carefully for complications. HOME  CARE INSTRUCTIONS  No special instructions are needed after a transfusion. You may find your energy is better. Speak with your caregiver about any limitations on activity for underlying diseases you may have. SEEK MEDICAL CARE IF:  Your condition is not improving after your transfusion. You develop redness or irritation at the intravenous (IV) site. SEEK IMMEDIATE MEDICAL CARE IF:  Any of the following symptoms occur over the next 12 hours: Shaking chills. You have a temperature by mouth above 102 F (38.9 C), not controlled by medicine. Chest, back, or muscle pain. People around you feel you are not acting correctly or are confused. Shortness of breath or difficulty breathing. Dizziness and fainting. You get a rash or develop hives. You have a decrease in urine output. Your urine turns a dark color or changes to pink, red, or brown. Any of the following symptoms occur over the next 10 days: You have a temperature by mouth above 102 F (38.9 C), not controlled by medicine. Shortness of breath. Weakness after normal activity. The white part of the eye turns yellow (jaundice). You have a decrease in the amount of urine or are urinating less often. Your urine turns a dark color or changes to pink, red, or  brown. Document Released: 11/30/2000 Document Revised: 02/25/2012 Document Reviewed: 07/19/2008 [facility] Patient Information 2014 [place], [place].

## 2024-08-21 NOTE — Progress Notes (Addendum)
 COVID Vaccine received:  [x]  No []  Yes Date of any COVID positive Test in last 90 days: no PCP - Dr. Butler Der Cardiologist - n/a  Chest x-ray -  EKG -   Stress Test -  ECHO -  Cardiac Cath -   Bowel Prep - [x]  No  []   Yes ______  Pacemaker / ICD device [x]  No []  Yes   Spinal Cord Stimulator:[x]  No []  Yes       History of Sleep Apnea? [x]  No []  Yes   CPAP used?- [x]  No []  Yes    Does the patient monitor blood sugar?          []  No [x]  Yes  []  N/A  Patient has: []  NO Hx DM   []  Pre-DM                 []  DM1  [x]   DM2 Does patient have a [employer] or Dexacom? [x]  No []  Yes   Fasting Blood Sugar Ranges- 130-150 Checks Blood Sugar ___3__ times a week  GLP1 agonist / usual dose - no GLP1 instructions:  SGLT-2 inhibitors / usual dose - no SGLT-2 instructions:   Blood Thinner / Instructions:no Aspirin Instructions:no  Comments:   Activity level: Patient is able  to climb a flight of stairs without difficulty; [x]  No CP  [x]  No SOB,    Patient can perform ADLs without assistance.   Anesthesia review:   Patient denies shortness of breath, fever, cough and chest pain at PAT appointment.  Patient verbalized understanding and agreement to the Pre-Surgical Instructions that were given to them at this PAT appointment. Patient was also educated of the need to review these PAT instructions again prior to his/her surgery.I reviewed the appropriate phone numbers to call if they have any and questions or concerns.

## 2024-08-21 NOTE — Telephone Encounter (Signed)
 Error

## 2024-08-21 NOTE — Telephone Encounter (Signed)
 Received PCP clearance.

## 2024-08-24 ENCOUNTER — Ambulatory Visit: Payer: Self-pay

## 2024-08-24 ENCOUNTER — Encounter (HOSPITAL_COMMUNITY)
Admission: RE | Admit: 2024-08-24 | Discharge: 2024-08-24 | Disposition: A | Discharge status: Home / Self Care | Source: Ambulatory Visit | Attending: Gynecologic Oncology | Admitting: Gynecologic Oncology

## 2024-08-24 ENCOUNTER — Telehealth: Payer: Self-pay

## 2024-08-24 ENCOUNTER — Other Ambulatory Visit: Payer: Self-pay

## 2024-08-24 VITALS — BP 146/78 | HR 60 | Temp 98.5°F | Resp 18 | Ht 68.0 in | Wt 227.0 lb

## 2024-08-24 DIAGNOSIS — N8502 Endometrial intraepithelial neoplasia [EIN]: Secondary | ICD-10-CM | POA: Diagnosis not present

## 2024-08-24 DIAGNOSIS — Z01818 Encounter for other preprocedural examination: Secondary | ICD-10-CM | POA: Insufficient documentation

## 2024-08-24 DIAGNOSIS — N838 Other noninflammatory disorders of ovary, fallopian tube and broad ligament: Secondary | ICD-10-CM | POA: Diagnosis not present

## 2024-08-24 DIAGNOSIS — R9431 Abnormal electrocardiogram [ECG] [EKG]: Secondary | ICD-10-CM | POA: Insufficient documentation

## 2024-08-24 DIAGNOSIS — Z91041 Radiographic dye allergy status: Secondary | ICD-10-CM | POA: Diagnosis not present

## 2024-08-24 DIAGNOSIS — N8300 Follicular cyst of ovary, unspecified side: Secondary | ICD-10-CM | POA: Diagnosis not present

## 2024-08-24 DIAGNOSIS — E119 Type 2 diabetes mellitus without complications: Secondary | ICD-10-CM | POA: Diagnosis not present

## 2024-08-24 DIAGNOSIS — D259 Leiomyoma of uterus, unspecified: Secondary | ICD-10-CM | POA: Diagnosis not present

## 2024-08-24 DIAGNOSIS — Z79899 Other long term (current) drug therapy: Secondary | ICD-10-CM | POA: Diagnosis not present

## 2024-08-24 DIAGNOSIS — K219 Gastro-esophageal reflux disease without esophagitis: Secondary | ICD-10-CM | POA: Diagnosis not present

## 2024-08-24 DIAGNOSIS — K5900 Constipation, unspecified: Secondary | ICD-10-CM | POA: Diagnosis not present

## 2024-08-24 LAB — COMPREHENSIVE METABOLIC PANEL WITH GFR
ALT: 84 U/L — ABNORMAL HIGH (ref 0–44)
AST: 35 U/L (ref 15–41)
Albumin: 4.5 g/dL (ref 3.5–5.0)
Alkaline Phosphatase: 50 U/L (ref 38–126)
Anion gap: 11 (ref 5–15)
BUN: 8 mg/dL (ref 6–20)
CO2: 23 mmol/L (ref 22–32)
Calcium: 9.7 mg/dL (ref 8.9–10.3)
Chloride: 104 mmol/L (ref 98–111)
Creatinine, Ser: 0.74 mg/dL (ref 0.44–1.00)
GFR, Estimated: >60 mL/min
Glucose, Bld: 170 mg/dL — ABNORMAL HIGH (ref 70–99)
Potassium: 4.1 mmol/L (ref 3.5–5.1)
Sodium: 138 mmol/L (ref 135–145)
Total Bilirubin: 0.4 mg/dL (ref 0.0–1.2)
Total Protein: 7.3 g/dL (ref 6.5–8.1)

## 2024-08-24 LAB — HEMOGLOBIN A1C
Hgb A1c MFr Bld: 6.4 % — ABNORMAL HIGH (ref 4.8–5.6)
Mean Plasma Glucose: 136.98 mg/dL

## 2024-08-24 LAB — GLUCOSE, CAPILLARY: Glucose-Capillary: 208 mg/dL — ABNORMAL HIGH (ref 70–99)

## 2024-08-24 NOTE — Telephone Encounter (Signed)
 Per [name] NP, recent lab results faxed to Pt's PCP Butler Der MD.

## 2024-08-24 NOTE — Telephone Encounter (Signed)
 FMLA forms received from [employer]. Requested information received and faxed.  Ms.Scull aware via MyChart

## 2024-08-24 NOTE — Telephone Encounter (Signed)
-----   Message from Eleanor JONETTA Epps sent at 08/24/2024 10:21 AM EDT ----- Please fax results to PCP ----- Message ----- From: Rebecka, [facility] [place] Sent: 08/24/2024   9:49 AM EDT To: Eleanor JONETTA Epps, NP

## 2024-08-25 ENCOUNTER — Telehealth: Payer: Self-pay | Admitting: *Deleted

## 2024-08-25 ENCOUNTER — Encounter: Payer: Self-pay | Admitting: Family Medicine

## 2024-08-25 ENCOUNTER — Ambulatory Visit: Payer: Self-pay | Admitting: Family Medicine

## 2024-08-25 ENCOUNTER — Ambulatory Visit: Admitting: Family Medicine

## 2024-08-25 ENCOUNTER — Ambulatory Visit (INDEPENDENT_AMBULATORY_CARE_PROVIDER_SITE_OTHER): Admitting: Family Medicine

## 2024-08-25 VITALS — BP 129/77 | HR 69 | Temp 98.2°F | Ht 68.0 in | Wt 224.0 lb

## 2024-08-25 DIAGNOSIS — Z6838 Body mass index (BMI) 38.0-38.9, adult: Secondary | ICD-10-CM

## 2024-08-25 DIAGNOSIS — N939 Abnormal uterine and vaginal bleeding, unspecified: Secondary | ICD-10-CM

## 2024-08-25 DIAGNOSIS — N8502 Endometrial intraepithelial neoplasia [EIN]: Secondary | ICD-10-CM

## 2024-08-25 DIAGNOSIS — E66812 Obesity, class 2: Secondary | ICD-10-CM | POA: Diagnosis not present

## 2024-08-25 NOTE — Progress Notes (Signed)
 Subjective:  Patient ID: [name], female    DOB: 1976-07-31  Age: 48 y.o. MRN: [id number]  CC: surgical clearance   HPI  Discussed the use of AI scribe software for clinical note transcription with the patient, who gave verbal consent to proceed.  History of Present Illness [name] is a 48 year old female who presents for pre-operative evaluation before a scheduled hysterectomy.  She has been experiencing heavy menstrual bleeding with blood clots since April. Initial treatment with a five-day course of hormone pills was ineffective, and a subsequent ten-day course of a stronger hormone pill also failed to alleviate her symptoms. Despite these treatments, the bleeding and clots persisted.  She underwent two endometrial biopsies, both of which were negative for cancer, although a thickened uterine lining was noted. She is scheduled for a full hysterectomy and lymph node evaluation tomorrow.  She has a history of diabetes, initially managed with metformin , but she opted to control it through weight loss. Her recent A1c was measured at 6.5 or 6.4, and she is not currently on any diabetes medication.  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          08/25/2024    3:26 PM 08/14/2024   11:38 AM 11/21/2023    1:01 PM  Depression screen PHQ 2/9  Decreased Interest 0 0 0  Down, Depressed, Hopeless 0 0 0  PHQ - 2 Score 0 0 0  Altered sleeping 0  0  Tired, decreased energy 1  0  Change in appetite 0  0  Feeling bad or failure about yourself  0  0  Trouble concentrating 0  0  Moving slowly or fidgety/restless 0  0  Suicidal thoughts 0  0  PHQ-9 Score 1  0  Difficult doing work/chores Not difficult at all  Not difficult at all    History [name] has a past medical history of Abnormal uterine bleeding (AUB), Diabetes mellitus without complication (HCC), Family history of breast cancer, and GERD  (gastroesophageal reflux disease).   She has a past surgical history that includes Cholecystectomy; Foot surgery (Right); bladder stent; and Dilation and curettage of uterus (08/03/2024).   Her family history includes Breast cancer in her mother; Diabetes in her mother; Lung cancer in her maternal grandmother and mother.She reports that she has never smoked. She has never used smokeless tobacco. She reports current alcohol use. She reports that she does not use drugs.    ROS Review of Systems  Constitutional: Negative.   HENT: Negative.    Eyes:  Negative for visual disturbance.  Respiratory:  Negative for shortness of breath.   Cardiovascular:  Negative for chest pain.  Gastrointestinal:  Negative for abdominal pain.  Genitourinary:  Positive for menstrual problem, pelvic pain and vaginal bleeding.  Musculoskeletal:  Negative for arthralgias.  Hematological: Negative.   Psychiatric/Behavioral: Negative.      Objective:  BP 129/77   Pulse 69   Temp 98.2 F (36.8 C)   Ht 5' 8 (1.727 m)   Wt 224 lb (101.6 kg)   LMP 03/17/2024 (Approximate)   SpO2 99%   BMI 34.06 kg/m   BP Readings from Last 3 Encounters:  08/25/24 129/77  08/24/24 (!) 146/78  08/14/24 132/68    Wt Readings from Last 3 Encounters:  08/25/24 224 lb (101.6 kg)  08/24/24 227 lb (103 kg)  08/14/24 230 lb 12.8 oz (104.7 kg)     Physical  Exam Constitutional:      General: She is not in acute distress.    Appearance: She is well-developed.  HENT:     Head: Normocephalic and atraumatic.  Eyes:     Conjunctiva/sclera: Conjunctivae normal.     Pupils: Pupils are equal, round, and reactive to light.  Neck:     Thyroid : No thyromegaly.  Cardiovascular:     Rate and Rhythm: Normal rate and regular rhythm.     Heart sounds: Normal heart sounds. No murmur heard. Pulmonary:     Effort: Pulmonary effort is normal. No respiratory distress.     Breath sounds: Normal breath sounds. No wheezing or rales.   Abdominal:     General: Bowel sounds are normal. There is no distension.     Palpations: Abdomen is soft.     Tenderness: There is no abdominal tenderness.  Musculoskeletal:        General: Normal range of motion.     Cervical back: Normal range of motion and neck supple.  Lymphadenopathy:     Cervical: No cervical adenopathy.  Skin:    General: Skin is warm and dry.  Neurological:     Mental Status: She is alert and oriented to person, place, and time.  Psychiatric:        Behavior: Behavior normal.        Thought Content: Thought content normal.        Judgment: Judgment normal.      Assessment & Plan:  EIN (endometrial intraepithelial neoplasia)  Abnormal uterine bleeding (AUB)  Class 2 obesity without serious comorbidity with body mass index (BMI) of 38.0 to 38.9 in adult, unspecified obesity type    Assessment and Plan Assessment & Plan Abnormal uterine bleeding with endometrial hyperplasia   Chronic abnormal uterine bleeding persists with endometrial hyperplasia. Hormone treatments have been ineffective. Two biopsies confirmed no endometrial cancer, but the thickened uterine lining remains concerning for potential malignancy. Pt. Is cleared to proceed with the scheduled full hysterectomy, salpingectomy and possible oophorectomy with lymph node evaluation. Low Risk.  Ensure all necessary documentation is faxed to the hospital before surgery.  Type 2 diabetes mellitus   Type 2 diabetes mellitus has been managed with lifestyle modifications. Current A1c levels are stable at 6.5-6.4, with no diabetes medication in use. Discussed the potential use of Mounjaro for weight loss after surgery recovery. Mounjaro is effective for weight loss but costly without insurance coverage for non-diabetics. Consider Mounjaro for weight loss post-recovery if desired.       Follow-up: No follow-ups on file.  Butler Der, M.D.

## 2024-08-25 NOTE — Telephone Encounter (Signed)
Telephone call to check on pre-operative status.  Patient compliant with pre-operative instructions.  Reinforced nothing to eat after midnight. Clear liquids until 1215. Patient to arrive at 1315.  No questions or concerns voiced.  Instructed to call for any needs.

## 2024-08-26 ENCOUNTER — Encounter (HOSPITAL_COMMUNITY)
Admission: RE | Disposition: A | Discharge status: Home / Self Care | Payer: Self-pay | Source: Home / Self Care | Attending: Gynecologic Oncology

## 2024-08-26 ENCOUNTER — Ambulatory Visit (HOSPITAL_COMMUNITY): Admitting: Anesthesiology

## 2024-08-26 ENCOUNTER — Encounter (HOSPITAL_COMMUNITY): Payer: Self-pay | Admitting: Gynecologic Oncology

## 2024-08-26 ENCOUNTER — Telehealth: Payer: Self-pay | Admitting: *Deleted

## 2024-08-26 ENCOUNTER — Ambulatory Visit (HOSPITAL_COMMUNITY)
Admission: RE | Admit: 2024-08-26 | Discharge: 2024-08-26 | Disposition: A | Discharge status: Home / Self Care | Attending: Gynecologic Oncology | Admitting: Gynecologic Oncology

## 2024-08-26 ENCOUNTER — Other Ambulatory Visit: Payer: Self-pay

## 2024-08-26 ENCOUNTER — Encounter (HOSPITAL_COMMUNITY): Payer: Self-pay | Admitting: Medical

## 2024-08-26 DIAGNOSIS — D259 Leiomyoma of uterus, unspecified: Secondary | ICD-10-CM | POA: Diagnosis not present

## 2024-08-26 DIAGNOSIS — N8302 Follicular cyst of left ovary: Secondary | ICD-10-CM | POA: Diagnosis not present

## 2024-08-26 DIAGNOSIS — N858 Other specified noninflammatory disorders of uterus: Secondary | ICD-10-CM | POA: Diagnosis not present

## 2024-08-26 DIAGNOSIS — K5900 Constipation, unspecified: Secondary | ICD-10-CM | POA: Diagnosis not present

## 2024-08-26 DIAGNOSIS — E66812 Obesity, class 2: Secondary | ICD-10-CM

## 2024-08-26 DIAGNOSIS — E119 Type 2 diabetes mellitus without complications: Secondary | ICD-10-CM | POA: Insufficient documentation

## 2024-08-26 DIAGNOSIS — Z79899 Other long term (current) drug therapy: Secondary | ICD-10-CM | POA: Insufficient documentation

## 2024-08-26 DIAGNOSIS — N8301 Follicular cyst of right ovary: Secondary | ICD-10-CM | POA: Diagnosis not present

## 2024-08-26 DIAGNOSIS — Z91041 Radiographic dye allergy status: Secondary | ICD-10-CM | POA: Diagnosis not present

## 2024-08-26 DIAGNOSIS — K219 Gastro-esophageal reflux disease without esophagitis: Secondary | ICD-10-CM | POA: Diagnosis not present

## 2024-08-26 DIAGNOSIS — N8300 Follicular cyst of ovary, unspecified side: Secondary | ICD-10-CM | POA: Insufficient documentation

## 2024-08-26 DIAGNOSIS — N838 Other noninflammatory disorders of ovary, fallopian tube and broad ligament: Secondary | ICD-10-CM | POA: Diagnosis not present

## 2024-08-26 DIAGNOSIS — N8502 Endometrial intraepithelial neoplasia [EIN]: Secondary | ICD-10-CM | POA: Diagnosis not present

## 2024-08-26 HISTORY — PX: INJECTION, FOR SENTINEL LYMPH NODE IDENTIFICATION: SHX7598

## 2024-08-26 HISTORY — PX: HYSTERECTOMY, TOTAL, LAPAROSCOPIC, ROBOT-ASSISTED WITH SALPINGECTOMY: SHX7587

## 2024-08-26 LAB — TYPE AND SCREEN
ABO/RH(D): O NEG
Antibody Screen: NEGATIVE

## 2024-08-26 LAB — GLUCOSE, CAPILLARY
Glucose-Capillary: 127 mg/dL — ABNORMAL HIGH (ref 70–99)
Glucose-Capillary: 154 mg/dL — ABNORMAL HIGH (ref 70–99)

## 2024-08-26 LAB — ABO/RH: ABO/RH(D): O NEG

## 2024-08-26 LAB — POCT PREGNANCY, URINE: Preg Test, Ur: NEGATIVE

## 2024-08-26 SURGERY — HYSTERECTOMY, TOTAL, LAPAROSCOPIC, ROBOT-ASSISTED WITH SALPINGECTOMY
Anesthesia: General

## 2024-08-26 MED ORDER — SODIUM CHLORIDE 0.9 % IV SOLN: 12.5000 mg | INTRAVENOUS | Status: DC | PRN

## 2024-08-26 MED ORDER — HEPARIN SODIUM (PORCINE) 5000 UNIT/ML IJ SOLN
5000.0000 [IU] | INTRAMUSCULAR | Status: AC
  Administered 2024-08-26: 5000 [IU] via SUBCUTANEOUS
  Filled 2024-08-26: qty 1

## 2024-08-26 MED ORDER — PROPOFOL 10 MG/ML IV BOLUS
INTRAVENOUS | Status: AC
Start: 2024-08-26 — End: 2024-08-26
  Filled 2024-08-26: qty 20

## 2024-08-26 MED ORDER — LIDOCAINE HCL (CARDIAC) PF 100 MG/5ML IV SOSY
PREFILLED_SYRINGE | INTRAVENOUS | Status: DC | PRN
  Administered 2024-08-26: 100 mg via INTRAVENOUS

## 2024-08-26 MED ORDER — ONDANSETRON HCL 4 MG/2ML IJ SOLN
INTRAMUSCULAR | Status: DC | PRN
  Administered 2024-08-26: 4 mg via INTRAVENOUS

## 2024-08-26 MED ORDER — DEXAMETHASONE SODIUM PHOSPHATE 10 MG/ML IJ SOLN
4.0000 mg | INTRAMUSCULAR | Status: AC
  Administered 2024-08-26: 4 mg via INTRAVENOUS

## 2024-08-26 MED ORDER — SUGAMMADEX SODIUM 200 MG/2ML IV SOLN
INTRAVENOUS | Status: AC
  Filled 2024-08-26: qty 2

## 2024-08-26 MED ORDER — STERILE WATER FOR IRRIGATION IR SOLN
Status: DC | PRN
  Administered 2024-08-26: 1000 mL

## 2024-08-26 MED ORDER — PROPOFOL 10 MG/ML IV BOLUS
INTRAVENOUS | Status: AC
  Filled 2024-08-26: qty 20

## 2024-08-26 MED ORDER — LACTATED RINGERS IV SOLN: INTRAVENOUS | Status: DC | PRN

## 2024-08-26 MED ORDER — OXYCODONE HCL 5 MG PO TABS
5.0000 mg | ORAL_TABLET | Freq: Once | ORAL | Status: AC | PRN
  Administered 2024-08-26: 5 mg via ORAL

## 2024-08-26 MED ORDER — GABAPENTIN 300 MG PO CAPS
300.0000 mg | ORAL_CAPSULE | ORAL | Status: AC
  Administered 2024-08-26: 300 mg via ORAL
  Filled 2024-08-26: qty 3

## 2024-08-26 MED ORDER — AMISULPRIDE (ANTIEMETIC) 5 MG/2ML IV SOLN: 10.0000 mg | Freq: Once | INTRAVENOUS | Status: DC | PRN

## 2024-08-26 MED ORDER — CEFAZOLIN SODIUM-DEXTROSE 2-4 GM/100ML-% IV SOLN
2.0000 g | INTRAVENOUS | Status: AC
  Administered 2024-08-26: 2 g via INTRAVENOUS
  Filled 2024-08-26: qty 100

## 2024-08-26 MED ORDER — DEXAMETHASONE SODIUM PHOSPHATE 10 MG/ML IJ SOLN
INTRAMUSCULAR | Status: AC
  Filled 2024-08-26: qty 2

## 2024-08-26 MED ORDER — FENTANYL CITRATE (PF) 250 MCG/5ML IJ SOLN
INTRAMUSCULAR | Status: AC
  Filled 2024-08-26: qty 5

## 2024-08-26 MED ORDER — KETAMINE HCL 50 MG/5ML IJ SOSY
PREFILLED_SYRINGE | INTRAMUSCULAR | Status: DC | PRN
  Administered 2024-08-26: 30 mg via INTRAVENOUS

## 2024-08-26 MED ORDER — ACETAMINOPHEN 500 MG PO TABS
1000.0000 mg | ORAL_TABLET | ORAL | Status: AC
  Administered 2024-08-26: 500 mg via ORAL
  Filled 2024-08-26: qty 2

## 2024-08-26 MED ORDER — STERILE WATER FOR INJECTION IJ SOLN
INTRAMUSCULAR | Status: DC | PRN
  Administered 2024-08-26: 4 mL

## 2024-08-26 MED ORDER — LACTATED RINGERS IV SOLN
INTRAVENOUS | Status: DC
Start: 2024-08-26 — End: 2024-08-26

## 2024-08-26 MED ORDER — PROPOFOL 10 MG/ML IV BOLUS
INTRAVENOUS | Status: DC | PRN
  Administered 2024-08-26: 200 mg via INTRAVENOUS

## 2024-08-26 MED ORDER — BUPIVACAINE HCL 0.25 % IJ SOLN
INTRAMUSCULAR | Status: DC | PRN
  Administered 2024-08-26: 27 mL

## 2024-08-26 MED ORDER — MIDAZOLAM HCL 5 MG/5ML IJ SOLN
INTRAMUSCULAR | Status: DC | PRN
  Administered 2024-08-26: 2 mg via INTRAVENOUS

## 2024-08-26 MED ORDER — MIDAZOLAM HCL 2 MG/2ML IJ SOLN
INTRAMUSCULAR | Status: AC
  Filled 2024-08-26: qty 2

## 2024-08-26 MED ORDER — FENTANYL CITRATE (PF) 100 MCG/2ML IJ SOLN
INTRAMUSCULAR | Status: DC | PRN
  Administered 2024-08-26: 50 ug via INTRAVENOUS
  Administered 2024-08-26: 100 ug via INTRAVENOUS
  Administered 2024-08-26 (×2): 50 ug via INTRAVENOUS

## 2024-08-26 MED ORDER — KETAMINE HCL 50 MG/5ML IJ SOSY
PREFILLED_SYRINGE | INTRAMUSCULAR | Status: AC
  Filled 2024-08-26: qty 5

## 2024-08-26 MED ORDER — SCOPOLAMINE 1 MG/3DAYS TD PT72
1.0000 | MEDICATED_PATCH | TRANSDERMAL | Status: DC
  Administered 2024-08-26: 1 mg via TRANSDERMAL
  Filled 2024-08-26: qty 1

## 2024-08-26 MED ORDER — SCOPOLAMINE 1 MG/3DAYS TD PT72: 1.0000 | MEDICATED_PATCH | TRANSDERMAL | Status: DC

## 2024-08-26 MED ORDER — CHLORHEXIDINE GLUCONATE 0.12 % MT SOLN: 15.0000 mL | Freq: Once | OROMUCOSAL | Status: DC

## 2024-08-26 MED ORDER — BUPIVACAINE HCL (PF) 0.25 % IJ SOLN
INTRAMUSCULAR | Status: AC
Start: 2024-08-26 — End: 2024-08-26
  Filled 2024-08-26: qty 30

## 2024-08-26 MED ORDER — LACTATED RINGERS IR SOLN
Status: DC | PRN
  Administered 2024-08-26: 1

## 2024-08-26 MED ORDER — ROCURONIUM BROMIDE 100 MG/10ML IV SOLN
INTRAVENOUS | Status: DC | PRN
  Administered 2024-08-26: 60 mg via INTRAVENOUS
  Administered 2024-08-26: 10 mg via INTRAVENOUS

## 2024-08-26 MED ORDER — FENTANYL CITRATE PF 50 MCG/ML IJ SOSY
25.0000 ug | PREFILLED_SYRINGE | INTRAMUSCULAR | Status: DC | PRN
  Administered 2024-08-26 (×3): 50 ug via INTRAVENOUS

## 2024-08-26 MED ORDER — DEXMEDETOMIDINE HCL IN NACL 80 MCG/20ML IV SOLN
INTRAVENOUS | Status: DC | PRN
Start: 2024-08-26 — End: 2024-08-26
  Administered 2024-08-26: 12 ug via INTRAVENOUS
  Administered 2024-08-26: 8 ug via INTRAVENOUS

## 2024-08-26 MED ORDER — ACETAMINOPHEN 500 MG PO TABS: 1000.0000 mg | ORAL_TABLET | Freq: Once | ORAL | Status: DC

## 2024-08-26 MED ORDER — FENTANYL CITRATE PF 50 MCG/ML IJ SOSY
PREFILLED_SYRINGE | INTRAMUSCULAR | Status: AC
  Filled 2024-08-26: qty 3

## 2024-08-26 MED ORDER — METRONIDAZOLE 500 MG/100ML IV SOLN
500.0000 mg | INTRAVENOUS | Status: AC
  Administered 2024-08-26: 500 mg via INTRAVENOUS
  Filled 2024-08-26: qty 100

## 2024-08-26 MED ORDER — SUGAMMADEX SODIUM 200 MG/2ML IV SOLN
INTRAVENOUS | Status: DC | PRN
Start: 2024-08-26 — End: 2024-08-26
  Administered 2024-08-26: 200 mg via INTRAVENOUS

## 2024-08-26 MED ORDER — OXYCODONE HCL 5 MG/5ML PO SOLN: 5.0000 mg | Freq: Once | ORAL | Status: AC | PRN

## 2024-08-26 MED ORDER — OXYCODONE HCL 5 MG PO TABS
ORAL_TABLET | ORAL | Status: AC
  Filled 2024-08-26: qty 1

## 2024-08-26 MED ORDER — ONDANSETRON HCL 4 MG/2ML IJ SOLN
INTRAMUSCULAR | Status: AC
  Filled 2024-08-26: qty 8

## 2024-08-26 MED ORDER — ORAL CARE MOUTH RINSE: 15.0000 mL | Freq: Once | OROMUCOSAL | Status: DC

## 2024-08-26 SURGICAL SUPPLY — 69 items
APPLICATOR SURGIFLO ENDO (HEMOSTASIS) IMPLANT
BAG COUNTER SPONGE SURGICOUNT (BAG) IMPLANT
BAG LAPAROSCOPIC 12 15 PORT 16 (BASKET) IMPLANT
BLADE SURG SZ10 CARB STEEL (BLADE) IMPLANT
COVER BACK TABLE 60X90IN (DRAPES) ×3 IMPLANT
COVER TIP SHEARS 8 DVNC (MISCELLANEOUS) ×3 IMPLANT
DERMABOND ADVANCED .7 DNX12 (GAUZE/BANDAGES/DRESSINGS) ×3 IMPLANT
DRAPE ARM DVNC X/XI (DISPOSABLE) ×12 IMPLANT
DRAPE COLUMN DVNC XI (DISPOSABLE) ×3 IMPLANT
DRAPE SHEET LG 3/4 BI-LAMINATE (DRAPES) ×3 IMPLANT
DRAPE SURG IRRIG POUCH 19X23 (DRAPES) ×3 IMPLANT
DRIVER NDL MEGA SUTCUT DVNCXI (INSTRUMENTS) ×3 IMPLANT
DRIVER NDLE MEGA SUTCUT DVNCXI (INSTRUMENTS) ×2 IMPLANT
DRSG OPSITE POSTOP 4X6 (GAUZE/BANDAGES/DRESSINGS) IMPLANT
DRSG OPSITE POSTOP 4X8 (GAUZE/BANDAGES/DRESSINGS) IMPLANT
ELECT PENCIL ROCKER SW 15FT (MISCELLANEOUS) IMPLANT
ELECT REM PT RETURN 15FT ADLT (MISCELLANEOUS) ×3 IMPLANT
FORCEPS BPLR FENES DVNC XI (FORCEP) ×3 IMPLANT
FORCEPS PROGRASP DVNC XI (FORCEP) ×3 IMPLANT
GAUZE 4X4 16PLY ~~LOC~~+RFID DBL (SPONGE) ×6 IMPLANT
GLOVE BIO SURGEON STRL SZ 6 (GLOVE) ×12 IMPLANT
GLOVE BIO SURGEON STRL SZ 6.5 (GLOVE) ×3 IMPLANT
GOWN STRL REUS W/ TWL LRG LVL3 (GOWN DISPOSABLE) ×12 IMPLANT
GRASPER SUT TROCAR 14GX15 (MISCELLANEOUS) IMPLANT
HOLDER FOLEY CATH W/STRAP (MISCELLANEOUS) IMPLANT
IRRIGATION SUCT STRKRFLW 2 WTP (MISCELLANEOUS) ×3 IMPLANT
KIT PROCEDURE DVNC SI (MISCELLANEOUS) IMPLANT
KIT TURNOVER KIT A (KITS) ×3 IMPLANT
LIGASURE IMPACT 36 18CM CVD LR (INSTRUMENTS) IMPLANT
MANIPULATOR ADVINCU DEL 3.0 PL (MISCELLANEOUS) IMPLANT
MANIPULATOR ADVINCU DEL 3.5 PL (MISCELLANEOUS) IMPLANT
MANIPULATOR UTERINE 4.5 ZUMI (MISCELLANEOUS) IMPLANT
NDL HYPO 21X1.5 SAFETY (NEEDLE) ×3 IMPLANT
NDL SPNL 18GX3.5 QUINCKE PK (NEEDLE) IMPLANT
NEEDLE HYPO 21X1.5 SAFETY (NEEDLE) ×2 IMPLANT
NEEDLE SPNL 18GX3.5 QUINCKE PK (NEEDLE) IMPLANT
OBTURATOR OPTICALSTD 8 DVNC (TROCAR) ×3 IMPLANT
PACK ROBOT GYN CUSTOM WL (TRAY / TRAY PROCEDURE) ×3 IMPLANT
PAD POSITIONING PINK XL (MISCELLANEOUS) ×3 IMPLANT
PORT ACCESS TROCAR AIRSEAL 12 (TROCAR) IMPLANT
SCISSORS LAP 5X45 EPIX DISP (ENDOMECHANICALS) IMPLANT
SCISSORS MNPLR CVD DVNC XI (INSTRUMENTS) ×3 IMPLANT
SCRUB CHG 4% DYNA-HEX 4OZ (MISCELLANEOUS) IMPLANT
SEAL UNIV 5-12 XI (MISCELLANEOUS) ×12 IMPLANT
SET TRI-LUMEN FLTR TB AIRSEAL (TUBING) ×3 IMPLANT
SPIKE FLUID TRANSFER (MISCELLANEOUS) ×3 IMPLANT
SPONGE T-LAP 18X18 ~~LOC~~+RFID (SPONGE) IMPLANT
SURGIFLO W/THROMBIN 8M KIT (HEMOSTASIS) IMPLANT
SUT MNCRL AB 4-0 PS2 18 (SUTURE) IMPLANT
SUT PDS AB 1 TP1 96 (SUTURE) IMPLANT
SUT STRATA PDS 0 30 CT-2.5 (SUTURE) IMPLANT
SUT STRATAFIX PDS+0 CT1 9 (SUTURE) IMPLANT
SUT V-LOC 180 0-0 GS22 (SUTURE) IMPLANT
SUT VIC AB 0 CT1 27XBRD ANTBC (SUTURE) IMPLANT
SUT VIC AB 2-0 CT1 TAPERPNT 27 (SUTURE) IMPLANT
SUT VIC AB 2-0 SH 27X BRD (SUTURE) IMPLANT
SUT VIC AB 4-0 PS2 18 (SUTURE) ×6 IMPLANT
SUT VICRYL 0 27 CT2 27 ABS (SUTURE) ×3 IMPLANT
SUT VLOC 180 0 9IN GS21 (SUTURE) IMPLANT
SYR 10ML LL (SYRINGE) IMPLANT
SYSTEM BAG RETRIEVAL 10MM (BASKET) IMPLANT
SYSTEM WOUND ALEXIS 18CM MED (MISCELLANEOUS) IMPLANT
TRAP SPECIMEN MUCUS 40CC (MISCELLANEOUS) IMPLANT
TRAY FOLEY MTR SLVR 16FR STAT (SET/KITS/TRAYS/PACK) ×3 IMPLANT
TROCAR PORT AIRSEAL 5X120 (TROCAR) IMPLANT
TROCAR XCEL NON-BLD 5MMX100MML (ENDOMECHANICALS) ×3 IMPLANT
UNDERPAD 30X36 HEAVY ABSORB (UNDERPADS AND DIAPERS) ×6 IMPLANT
WATER STERILE IRR 1000ML POUR (IV SOLUTION) ×3 IMPLANT
YANKAUER SUCT BULB TIP 10FT TU (MISCELLANEOUS) IMPLANT

## 2024-08-26 NOTE — Interval H&P Note (Signed)
 History and Physical Interval Note:  08/26/2024 2:00 PM  [name]  has presented today for surgery, with the diagnosis of EIN.  The various methods of treatment have been discussed with the patient and family. After consideration of risks, benefits and other options for treatment, the patient has consented to  Procedure(s) with comments: HYSTERECTOMY, TOTAL, LAPAROSCOPIC, ROBOT-ASSISTED WITH SALPINGECTOMY (Bilateral) - POSSIBLE BILATERAL OOPHORECTOMY INJECTION, FOR SENTINEL LYMPH NODE IDENTIFICATION (N/A) LYMPH NODE BIOPSY (N/A) LYMPHADENECTOMY, PARA-AORTIC AND PELVIC, ROBOT-ASSISTED, LAPAROSCOPIC (N/A) as a surgical intervention.  The patient's history has been reviewed, patient examined, no change in status, stable for surgery.  I have reviewed the patient's chart and labs.  Questions were answered to the patient's satisfaction.     Comer JONELLE Dollar

## 2024-08-26 NOTE — Op Note (Signed)
 OPERATIVE NOTE  Pre-operative Diagnosis: endometrial intraepithelial neoplasia  Post-operative Diagnosis: same, no invasive cancer on frozen, no gross lesions  Operation: Robotic-assisted laparoscopic total hysterectomy with bilateral salpingoophorectomy, SLN mapping   Surgeon: Viktoria Crank MD  Assistant Surgeon: Olam Leonce Ada MD (an MD assistant was necessary for tissue manipulation, management of robotic instrumentation, retraction and positioning due to the complexity of the case and hospital policies).   Anesthesia: GET  Urine Output: 200cc  Operative Findings: On EUA, mobile uterus. On intra-abdominal entry, normal upper abdominal survey. Normal omentum, small and large bowel. Uterus 8 cm. Normal appearing bilateral tubes and ovaries. Mapping successful to bilateral external iliac SLNs. On frozen, no gross lesion seen, and benign endometrium microscopically.  Estimated Blood Loss:  50 cc      Total IV Fluids: see I&O flowsheet         Specimens: uterus, cervix, bilateral tubes and ovaries         Complications:  None apparent; patient tolerated the procedure well.         Disposition: PACU - hemodynamically stable.  Procedure Details  The patient was seen in the Holding Room. The risks, benefits, complications, treatment options, and expected outcomes were discussed with the patient.  The patient concurred with the proposed plan, giving informed consent.  The site of surgery properly noted/marked. The patient was identified as [name] and the procedure verified as a Robotic-assisted hysterectomy with bilateral salpingo oophorectomy with possible SLN biopsy.   After induction of anesthesia, the patient was draped and prepped in the usual sterile manner. Patient was placed in supine position after anesthesia and draped and prepped in the usual sterile manner as follows: Her arms were tucked to her side with all appropriate precautions.  The patient was secured to  the bed using padding and tape across her chest.  The patient was placed in the semi-lithotomy position in [place] stirrups.  The perineum and vagina were prepped with CHG. The patient's abdomen was prepped with ChloraPrep and she was draped after the prep had been allowed to dry for 3 minutes.  A Time Out was held and the above information confirmed.  The urethra was prepped with Betadine. Foley catheter was placed.  A sterile speculum was placed in the vagina.  The cervix was grasped with a single-tooth tenaculum. 2mg  total of ICG was injected into the cervical stroma at 2 and 9 o'clock with 1cc injected at a 1cm and 2mm depth (concentration 0.5mg /ml) in all locations. The cervix was dilated with Fredirick dilators.  The ZUMI uterine manipulator with a medium colpotomizer ring was placed without difficulty.  A pneum occluder balloon was placed over the manipulator.  OG tube placement was confirmed and to suction.   Next, a 10 mm skin incision was made 1 cm below the subcostal margin in the midclavicular line.  The 5 mm Optiview port and scope was used for direct entry.  Opening pressure was under 10 mm CO2.  The abdomen was insufflated and the findings were noted as above.   At this point and all points during the procedure, the patient's intra-abdominal pressure did not exceed 15 mmHg. Next, an 8 mm skin incision was made superior to the umbilicus and a right and left port were placed about 8 cm lateral to the robot port on the right and left side.  A fourth arm was placed on the right.  The 5 mm assist trocar was exchanged for a 10-12 mm port. All ports were  placed under direct visualization.  The patient was placed in steep Trendelenburg.  Bowel was folded away into the upper abdomen.  The robot was docked in the normal manner.  The right and left peritoneum were opened parallel to the IP ligament to open the retroperitoneal spaces bilaterally. The round ligaments were transected. The SLN mapping was performed  in bilateral pelvic basins. After identifying the ureters, the para rectal and paravesical spaces were opened up entirely with careful dissection below the level of the ureters bilaterally and to the depth of the uterine artery origin in order to skeletonize the uterine web and ensure visualization of all parametrial channels. The para-aortic basins were carefully exposed and evaluated for isolated para-aortic SLN's. Lymphatic channels were identified travelling to the following visualized sentinel lymph node's: bilateral external iliac SLNs. Each sentinel lymph node was marked but not removed at this time.   The hysterectomy was started.  The ureter was again noted to be on the medial leaf of the broad ligament.  The peritoneum above the ureter was incised and stretched and the infundibulopelvic ligament was skeletonized, cauterized and cut.  The posterior peritoneum was taken down to the level of the KOH ring.  The anterior peritoneum was also taken down.  The bladder flap was created to the level of the KOH ring.  The uterine artery on the right side was skeletonized, cauterized and cut in the normal manner.  A similar procedure was performed on the left.  The colpotomy was made and the uterus, cervix, bilateral ovaries and tubes were amputated and delivered through the vagina.  Pedicles were inspected and excellent hemostasis was achieved.    The colpotomy at the vaginal cuff was closed with 0 Vicryl with a figure of eight at each apex and 0 Stratafix to close the midportion of the cuff in two layers in a running manner.  Irrigation was used and excellent hemostasis was achieved.    When frozen results returned, the procedure was completed.  Robotic instruments were removed under direct visulaization.  The robot was undocked. The fascia at the 12 mm port was closed with 0 Vicryl using a PMI fascial closure device under direct visualization.  The subcuticular tissue was closed with 4-0 Vicryl and the skin  was closed with 4-0 Monocryl in a subcuticular manner.  Dermabond was applied.    The vagina was swabbed with some bleeding from a superficial tear along the right lateral vagina. This was made hemostatic with running locked 2-0 Vicryl. The vagina was swabbed again with  minimal bleeding noted. Foley catheter was removed  All sponge, lap and needle counts were correct x  3.   The patient was transferred to the recovery room in stable condition.  Comer Dollar, MD

## 2024-08-26 NOTE — Anesthesia Procedure Notes (Signed)
 Procedure Name: Intubation Date/Time: 08/26/2024 2:47 PM  Performed by: Dartha Meckel, CRNAPre-anesthesia Checklist: Patient identified, Emergency Drugs available, Suction available and Patient being monitored Patient Re-evaluated:Patient Re-evaluated prior to induction Oxygen Delivery Method: Circle system utilized Preoxygenation: Pre-oxygenation with 100% oxygen Induction Type: IV induction Ventilation: Mask ventilation without difficulty Laryngoscope Size: Mac and 3 Grade View: Grade I Tube type: Oral Tube size: 7.0 mm Number of attempts: 1 Airway Equipment and Method: Stylet and Oral airway Placement Confirmation: ETT inserted through vocal cords under direct vision, positive ETCO2 and breath sounds checked- equal and bilateral Secured at: 21 cm Tube secured with: Tape Dental Injury: Teeth and Oropharynx as per pre-operative assessment

## 2024-08-26 NOTE — Anesthesia Postprocedure Evaluation (Signed)
 Anesthesia Post Note  Patient: [name]  Procedure(s) Performed: HYSTERECTOMY, TOTAL, LAPAROSCOPIC, ROBOT-ASSISTED WITH SALPINGECTOMY (Bilateral) INJECTION, FOR SENTINEL LYMPH NODE IDENTIFICATION     Patient location during evaluation: PACU Anesthesia Type: General Level of consciousness: awake and alert Pain management: pain level controlled Vital Signs Assessment: post-procedure vital signs reviewed and stable Respiratory status: spontaneous breathing, nonlabored ventilation and respiratory function stable Cardiovascular status: stable and blood pressure returned to baseline Anesthetic complications: no   No notable events documented.  Last Vitals:  Vitals:   08/26/24 1717 08/26/24 1748  BP:    Pulse: 81   Resp: 17 15  Temp:  (!) 36.4 C  SpO2: 97%     Last Pain:  Vitals:   08/26/24 1717  TempSrc:   PainSc: 6                  Debby FORBES Like

## 2024-08-26 NOTE — Telephone Encounter (Signed)
 Received PCP clearance.

## 2024-08-26 NOTE — Anesthesia Preprocedure Evaluation (Addendum)
 Anesthesia Evaluation  Patient identified by MRN, date of birth, ID band Patient awake    Reviewed: Allergy & Precautions, NPO status , Patient's Chart, lab work & pertinent test results  History of Anesthesia Complications Negative for: history of anesthetic complications  Airway Mallampati: II  TM Distance: >3 FB Neck ROM: Full    Dental  (+) Dental Advisory Given, Teeth Intact   Pulmonary neg pulmonary ROS   Pulmonary exam normal        Cardiovascular negative cardio ROS Normal cardiovascular exam     Neuro/Psych negative neurological ROS  negative psych ROS   GI/Hepatic Neg liver ROS,GERD  Controlled,,  Endo/Other  diabetes, Well Controlled, Type 2   Obesity   Renal/GU negative Renal ROS     Musculoskeletal negative musculoskeletal ROS (+)    Abdominal   Peds  Hematology negative hematology ROS (+)   Anesthesia Other Findings   Reproductive/Obstetrics  Endometrial intraepithelial neoplasia                               Anesthesia Physical Anesthesia Plan  ASA: 2  Anesthesia Plan: General   Post-op Pain Management: Tylenol  PO (pre-op)* and Toradol IV (intra-op)*   Induction: Intravenous  PONV Risk Score and Plan: 3 and Treatment may vary due to age or medical condition, Ondansetron , Dexamethasone , Midazolam  and Scopolamine  patch - Pre-op  Airway Management Planned: Oral ETT  Additional Equipment: None  Intra-op Plan:   Post-operative Plan: Extubation in OR  Informed Consent: I have reviewed the patients History and Physical, chart, labs and discussed the procedure including the risks, benefits and alternatives for the proposed anesthesia with the patient or authorized representative who has indicated his/her understanding and acceptance.     Dental advisory given  Plan Discussed with: CRNA and Anesthesiologist  Anesthesia Plan Comments:           Anesthesia Quick Evaluation

## 2024-08-26 NOTE — Discharge Instructions (Addendum)
 AFTER SURGERY INSTRUCTIONS   Return to work: 4-6 weeks if applicable   Activity: 1. Be up and out of the bed during the day.  Take a nap if needed.  You may walk up steps but be careful and use the hand rail.  Stair climbing will tire you more than you think, you may need to stop part way and rest.    2. No lifting or straining for 6 weeks over 10 pounds. No pushing, pulling, straining for 6 weeks.   3. No driving for 1-61 days when the following criteria have been met: Do not drive if you are taking narcotic pain medicine and make sure that your reaction time has returned.    4. You can shower as soon as the next day after surgery. Shower daily.  Use your regular soap and water  (not directly on the incision) and pat your incision(s) dry afterwards; don't rub.  No tub baths or submerging your body in water  until cleared by your surgeon. If you have the soap that was given to you by pre-surgical testing that was used before surgery, you do not need to use it afterwards because this can irritate your incisions.    5. No sexual activity and nothing in the vagina for 12 weeks.   6. You may experience a small amount of clear drainage from your incisions, which is normal.  If the drainage persists, increases, or changes color please call the office.   7. Do not use creams, lotions, or ointments such as neosporin on your incisions after surgery until advised by your surgeon because they can cause removal of the dermabond glue on your incisions.     8. You may experience vaginal spotting after surgery or when the stitches at the top of the vagina begin to dissolve.  The spotting is normal but if you experience heavy bleeding, call our office.   9. Take Tylenol or ibuprofen first for pain if you are able to take these medications and only use narcotic pain medication for severe pain not relieved by the Tylenol or Ibuprofen.  Monitor your Tylenol intake to a max of 4,000 mg in a 24 hour period. You can  alternate these medications after surgery.   Diet: 1. Low sodium Heart Healthy Diet is recommended but you are cleared to resume your normal (before surgery) diet after your procedure.   2. It is safe to use a laxative, such as Miralax or Colace, if you have difficulty moving your bowels before surgery. You have been prescribed Sennakot-S to take at bedtime every evening after surgery to keep bowel movements regular and to prevent constipation.     Wound Care: 1. Keep clean and dry.  Shower daily.   Reasons to call the Doctor: Fever - Oral temperature greater than 100.4 degrees Fahrenheit Foul-smelling vaginal discharge Difficulty urinating Nausea and vomiting Increased pain at the site of the incision that is unrelieved with pain medicine. Difficulty breathing with or without chest pain New calf pain especially if only on one side Sudden, continuing increased vaginal bleeding with or without clots.   Contacts: For questions or concerns you should contact:   Dr. Wiley Hanger at [phone]   Vira Grieves, NP at [phone]   After Hours: call [phone] and have the GYN Oncologist paged/contacted (after 5 pm or on the weekends). You will speak with an after hours RN and let he or she know you have had surgery.   Messages sent via mychart are for non-urgent  matters and are not responded to after hours so for urgent needs, please call the after hours number.

## 2024-08-26 NOTE — Transfer of Care (Signed)
 Immediate Anesthesia Transfer of Care Note  Patient: [name]  Procedure(s) Performed: HYSTERECTOMY, TOTAL, LAPAROSCOPIC, ROBOT-ASSISTED WITH SALPINGECTOMY (Bilateral) INJECTION, FOR SENTINEL LYMPH NODE IDENTIFICATION  Patient Location: PACU  Anesthesia Type:General  Level of Consciousness: awake and alert   Airway & Oxygen Therapy: Patient Spontanous Breathing and Patient connected to face mask oxygen  Post-op Assessment: Report given to RN and Post -op Vital signs reviewed and stable  Post vital signs: Reviewed and stable  Last Vitals:  Vitals Value Taken Time  BP 111/97 08/26/24 16:49  Temp    Pulse 65 08/26/24 16:54  Resp 18 08/26/24 16:54  SpO2 97 % 08/26/24 16:54  Vitals shown include unfiled device data.  Last Pain:  Vitals:   08/26/24 1421  TempSrc:   PainSc: 2       Patients Stated Pain Goal: 0 (08/26/24 1421)  Complications: No notable events documented.

## 2024-08-27 ENCOUNTER — Telehealth: Payer: Self-pay | Admitting: *Deleted

## 2024-08-27 ENCOUNTER — Encounter (HOSPITAL_COMMUNITY): Payer: Self-pay | Admitting: Gynecologic Oncology

## 2024-08-27 NOTE — Telephone Encounter (Signed)
 Attempted to reach patient for post op call. Left voicemail requesting call back.

## 2024-08-27 NOTE — Telephone Encounter (Signed)
 Spoke with [name] this morning. She states she is eating, drinking and urinating well. She has not had a BM yet but is passing gas. She is taking senokot as prescribed and encouraged her to drink plenty of water . She denies fever or chills. Incisions are dry and intact. She rates her pain 5/10. Her pain is controlled with tylenol , ibuprofen and she took first tramadol  this morning after taking her shower.     Instructed to call office with any fever, chills, purulent drainage, uncontrolled pain or any other questions or concerns. Patient verbalizes understanding.   Pt aware of post op appointments as well as the office number [phone] and after hours number [phone] to call if she has any questions or concerns

## 2024-08-31 ENCOUNTER — Ambulatory Visit: Payer: Self-pay | Admitting: Gynecologic Oncology

## 2024-08-31 LAB — SURGICAL PATHOLOGY

## 2024-09-02 ENCOUNTER — Inpatient Hospital Stay: Attending: Gynecologic Oncology | Admitting: Gynecologic Oncology

## 2024-09-02 ENCOUNTER — Encounter: Payer: Self-pay | Admitting: Gynecologic Oncology

## 2024-09-02 DIAGNOSIS — N8502 Endometrial intraepithelial neoplasia [EIN]: Secondary | ICD-10-CM

## 2024-09-02 DIAGNOSIS — Z90722 Acquired absence of ovaries, bilateral: Secondary | ICD-10-CM

## 2024-09-02 DIAGNOSIS — Z9079 Acquired absence of other genital organ(s): Secondary | ICD-10-CM

## 2024-09-02 DIAGNOSIS — E8940 Asymptomatic postprocedural ovarian failure: Secondary | ICD-10-CM

## 2024-09-02 DIAGNOSIS — Z90710 Acquired absence of both cervix and uterus: Secondary | ICD-10-CM

## 2024-09-02 NOTE — Progress Notes (Signed)
 Gynecologic Oncology Telehealth Note: Gyn-Onc  I connected with Tyson Earnie Staff on 09/02/24 at  6:00 PM EDT by telephone and verified that I am speaking with the correct person using two identifiers.  I discussed the limitations, risks, security and privacy concerns of performing an evaluation and management service by telemedicine and the availability of in-person appointments. I also discussed with the patient that there may be a patient responsible charge related to this service. The patient expressed understanding and agreed to proceed.  Other persons participating in the visit and their role in the encounter: none.  Patient's location: home, [place] Provider's location: [facility], [place]  Reason for Visit: follow-up  Treatment History: The patient initially presented with abnormal uterine bleeding at the end of 2024.  Withdrawal bleed was induced with the use of Provera in May. 05/06/2024: Pelvic ultrasound exam at [facility] shows a uterus measuring 9.6 x 6.8 x 5.8 cm with a thickened endometrium measuring 17 mm.  Bilateral ovaries normal in appearance without masses.  No free fluid. 05/06/2024: Endometrial biopsy reveals menstrual endometrium. 06/24/2024: Endometrial biopsy with secretory exhausted glands and pseudo decidual response consistent with progestin administration.  No hyperplasia, carcinoma, or endometritis. Patient with continued bleeding on 15 mg of Aygestin despite 2 biopsies negative for hyperplasia or malignancy. 08/03/2024: Hysteroscopy with endometrial sampling performed.  On hysteroscopy, endometrium noted to be thick and vascular.  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  08/26/24: Robotic-assisted laparoscopic total hysterectomy with bilateral salpingoophorectomy, SLN mapping   Interval History: Doing well.  Using Miralax and sennakot. Denies urinary symptoms.  Felt vaginal stitch. Denies vaginal bleeding.    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- surgery at childhood   CHOLECYSTECTOMY     DILATION AND CURETTAGE OF UTERUS  08/03/2024   FOOT SURGERY Right    HYSTERECTOMY, TOTAL, LAPAROSCOPIC, ROBOT-ASSISTED WITH SALPINGECTOMY Bilateral 08/26/2024   Procedure: HYSTERECTOMY, TOTAL, LAPAROSCOPIC, ROBOT-ASSISTED WITH SALPINGECTOMY;  Surgeon: Viktoria Comer SAUNDERS, MD;  Location: [facility];  Service: Gynecology;  Laterality: Bilateral;  POSSIBLE BILATERAL OOPHORECTOMY   INJECTION, FOR SENTINEL LYMPH NODE IDENTIFICATION N/A 08/26/2024   Procedure: INJECTION, FOR SENTINEL LYMPH NODE IDENTIFICATION;  Surgeon: Viktoria Comer SAUNDERS, MD;  Location: [facility];  Service: Gynecology;  Laterality: N/A;    Family History  Problem Relation Age of Onset   Lung cancer Mother    Breast cancer Mother    Diabetes Mother    Lung cancer Maternal Grandmother    Colon cancer Neg Hx    Esophageal cancer Neg Hx    Stomach cancer Neg Hx    Liver disease Neg Hx    Endometrial cancer Neg Hx    Ovarian cancer Neg Hx    Prostate cancer Neg Hx     Social History   Socioeconomic History   Marital status: Married    Spouse name: Not on file   Number of children: Not on file   Years of education: Not on file   Highest education level: Some college, no degree  Occupational History   Not on file  Tobacco Use   Smoking status: Never  Smokeless tobacco: Never  Vaping Use   Vaping status: Never Used  Substance and Sexual Activity   Alcohol use: Yes    Comment: occ   Drug use: No   Sexual activity: Yes    Comment: Not currently due to recent [facility]  Other Topics Concern   Not on file  Social History Narrative    Not on file   Social Drivers of Health   Financial Resource Strain: Low Risk  (08/21/2024)   Overall Financial Resource Strain (CARDIA)    Difficulty of Paying Living Expenses: Not hard at all  Food Insecurity: No Food Insecurity (08/21/2024)   Hunger Vital Sign    Worried About Running Out of Food in the Last Year: Never true    Ran Out of Food in the Last Year: Never true  Transportation Needs: No Transportation Needs (08/21/2024)   PRAPARE - Administrator, Civil Service (Medical): No    Lack of Transportation (Non-Medical): No  Physical Activity: Insufficiently Active (08/21/2024)   Exercise Vital Sign    Days of Exercise per Week: 3 days    Minutes of Exercise per Session: 30 min  Stress: Stress Concern Present (08/21/2024)   [employer] of Occupational Health - Occupational Stress Questionnaire    Feeling of Stress: To some extent  Social Connections: Moderately Isolated (08/21/2024)   Social Connection and Isolation Panel    Frequency of Communication with Friends and Family: Three times a week    Frequency of Social Gatherings with Friends and Family: Once a week    Attends Religious Services: Patient declined    Database administrator or Organizations: No    Attends Engineer, structural: Not on file    Marital Status: Married    Current Medications:  Current Outpatient Medications:    acetaminophen  (TYLENOL ) 500 MG tablet, Take 500-1,000 mg by mouth every 8 (eight) hours as needed (pain.)., Disp: , Rfl:    senna-docusate (SENOKOT-S) 8.6-50 MG tablet, Take 2 tablets by mouth at bedtime. For AFTER surgery, do not take if having diarrhea, Disp: 30 tablet, Rfl: 0   traMADol  (ULTRAM ) 50 MG tablet, Take 1 tablet (50 mg total) by mouth every 6 (six) hours as needed for severe pain (pain score 7-10). For AFTER surgery only, do not take and drive, Disp: 15 tablet, Rfl: 0  Review of Symptoms: Pertinent positives as per HPI.  Physical Exam: Deferred given  limitations of phone visit.  Laboratory & Radiologic Studies: A. UTERUS, CERVIX, BILATERAL TUBES AND OVARIES: Endometrium     Inactive endometrium     Negative for endometrial intraepithelial neoplasia (EIN) and malignancy     See comment Cervix     Nabothian cysts     Negative for dysplasia Myometrium     Leiomyoma     Negative for malignancy Right ovary     Benign follicular cysts     Negative for endometriosis and malignancy Left ovary     Benign follicular cyst     Negative for endometriosis and malignancy Right fallopian tube     Unremarkable     Negative for endometriosis and malignancy Left fallopian tube     Benign paratubal cyst     Negative for endometriosis and malignancy  Comment: The entire endometrium is submitted for histologic examination and shows inactive endometrium with stromal changes consistent with previous biopsy or curettage.  There is no residual endometrial intraepithelial neoplasia (EIN) or evidence of malignancy.   Assessment & Plan: Megan  Johan [name] is a 48 y.o. woman with EIN now s/p TRH/BSO with no residual EIN.  Doing very well, meeting milestones. Discussed continued expectations and restrictions.  Has had one hot flash and an episode of palpitations since surgery. Discussed option of starting ERT - she'd like to wait until in person follow-up to discuss more and see how symptoms are.  I discussed the assessment and treatment plan with the patient. The patient was provided with an opportunity to ask questions and all were answered. The patient agreed with the plan and demonstrated an understanding of the instructions.   The patient was advised to call back or see an in-person evaluation if the symptoms worsen or if the condition fails to improve as anticipated.   10 minutes of total time was spent for this patient encounter, including preparation, phone counseling with the patient and coordination of care, and documentation of the  encounter.   Comer Dollar, MD  Division of Gynecologic Oncology  Department of Obstetrics and Gynecology  [facility] [place]  [facility]

## 2024-09-17 ENCOUNTER — Inpatient Hospital Stay: Attending: Gynecologic Oncology | Admitting: Gynecologic Oncology

## 2024-09-17 ENCOUNTER — Encounter: Payer: Self-pay | Admitting: Gynecologic Oncology

## 2024-09-17 VITALS — BP 137/83 | HR 61 | Temp 99.4°F | Resp 19 | Wt 227.8 lb

## 2024-09-17 DIAGNOSIS — Z7189 Other specified counseling: Secondary | ICD-10-CM

## 2024-09-17 DIAGNOSIS — E8940 Asymptomatic postprocedural ovarian failure: Secondary | ICD-10-CM

## 2024-09-17 DIAGNOSIS — N760 Acute vaginitis: Secondary | ICD-10-CM

## 2024-09-17 DIAGNOSIS — N8502 Endometrial intraepithelial neoplasia [EIN]: Secondary | ICD-10-CM

## 2024-09-17 DIAGNOSIS — Z90722 Acquired absence of ovaries, bilateral: Secondary | ICD-10-CM

## 2024-09-17 DIAGNOSIS — Z90710 Acquired absence of both cervix and uterus: Secondary | ICD-10-CM

## 2024-09-17 DIAGNOSIS — Z9079 Acquired absence of other genital organ(s): Secondary | ICD-10-CM

## 2024-09-17 MED ORDER — CIPROFLOXACIN HCL 500 MG PO TABS: 500.0000 mg | ORAL_TABLET | Freq: Two times a day (BID) | ORAL | 0 refills | Status: DC

## 2024-09-17 MED ORDER — METRONIDAZOLE 500 MG PO TABS: 500.0000 mg | ORAL_TABLET | Freq: Two times a day (BID) | ORAL | 0 refills | Status: DC

## 2024-09-17 MED ORDER — ESTRADIOL 0.0375 MG/24HR TD PTTW: 1.0000 | MEDICATED_PATCH | TRANSDERMAL | 12 refills | Status: DC

## 2024-09-17 NOTE — Progress Notes (Signed)
 Gynecologic Oncology Return Clinic Visit  09/17/24  Reason for Visit: follow-up   Treatment History: The patient initially presented with abnormal uterine bleeding at the end of 2024.  Withdrawal bleed was induced with the use of Provera in May. 05/06/2024: Pelvic ultrasound exam at [facility] shows a uterus measuring 9.6 x 6.8 x 5.8 cm with a thickened endometrium measuring 17 mm.  Bilateral ovaries normal in appearance without masses.  No free fluid. 05/06/2024: Endometrial biopsy reveals menstrual endometrium. 06/24/2024: Endometrial biopsy with secretory exhausted glands and pseudo decidual response consistent with progestin administration.  No hyperplasia, carcinoma, or endometritis. Patient with continued bleeding on 15 mg of Aygestin despite 2 biopsies negative for hyperplasia or malignancy. 08/03/2024: Hysteroscopy with endometrial sampling performed.  On hysteroscopy, endometrium noted to be thick and vascular.  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   08/26/24: Robotic-assisted laparoscopic total hysterectomy with bilateral salpingo-oophorectomy, SLN mapping   Interval History: Overall doing well.  Still has some soreness around her incisions.  Feels some pressure in her pelvis, especially on days after she has been more active.  Denies any bleeding, had some very light spotting initially after surgery.  Reports good bowel function with the use of MiraLAX and Senokot daily.  Denies any urinary symptoms other than her baseline urinary incontinence.  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- surgery at childhood    CHOLECYSTECTOMY     DILATION AND CURETTAGE OF UTERUS  08/03/2024   FOOT SURGERY Right    HYSTERECTOMY, TOTAL, LAPAROSCOPIC, ROBOT-ASSISTED WITH SALPINGECTOMY Bilateral 08/26/2024   Procedure: HYSTERECTOMY, TOTAL, LAPAROSCOPIC, ROBOT-ASSISTED WITH SALPINGECTOMY;  Surgeon: Viktoria Comer SAUNDERS, MD;  Location: [facility];  Service: Gynecology;  Laterality: Bilateral;  POSSIBLE BILATERAL OOPHORECTOMY   INJECTION, FOR SENTINEL LYMPH NODE IDENTIFICATION N/A 08/26/2024   Procedure: INJECTION, FOR SENTINEL LYMPH NODE IDENTIFICATION;  Surgeon: Viktoria Comer SAUNDERS, MD;  Location: [facility];  Service: Gynecology;  Laterality: N/A;    Family History  Problem Relation Age of Onset   Lung cancer Mother    Breast cancer Mother    Diabetes Mother    Lung cancer Maternal Grandmother    Colon cancer Neg Hx    Esophageal cancer Neg Hx    Stomach cancer Neg Hx    Liver disease Neg Hx    Endometrial cancer Neg Hx    Ovarian cancer Neg Hx    Prostate cancer Neg Hx     Social History   Socioeconomic History   Marital status: Married    Spouse name: Not on file   Number of children: Not on file   Years of education: Not on file   Highest education level: Some college, no degree  Occupational History   Not on file  Tobacco Use   Smoking status: Never   Smokeless tobacco: Never  Vaping Use   Vaping status: Never Used  Substance and Sexual Activity   Alcohol use: Yes    Comment: occ   Drug use: No   Sexual activity: Yes    Comment: Not currently due to recent D&C  Other Topics Concern  Not on file  Social History Narrative   Not on file   Social Drivers of Health   Financial Resource Strain: Low Risk  (08/21/2024)   Overall Financial Resource Strain (CARDIA)    Difficulty of Paying Living Expenses: Not hard at all  Food Insecurity: No Food Insecurity (08/21/2024)   Hunger Vital Sign    Worried About Running Out of Food in the Last Year: Never true    Ran Out of Food in the Last Year: Never true   Transportation Needs: No Transportation Needs (08/21/2024)   PRAPARE - Administrator, Civil Service (Medical): No    Lack of Transportation (Non-Medical): No  Physical Activity: Insufficiently Active (08/21/2024)   Exercise Vital Sign    Days of Exercise per Week: 3 days    Minutes of Exercise per Session: 30 min  Stress: Stress Concern Present (08/21/2024)   [employer] of Occupational Health - Occupational Stress Questionnaire    Feeling of Stress: To some extent  Social Connections: Moderately Isolated (08/21/2024)   Social Connection and Isolation Panel    Frequency of Communication with Friends and Family: Three times a week    Frequency of Social Gatherings with Friends and Family: Once a week    Attends Religious Services: Patient declined    Database administrator or Organizations: No    Attends Engineer, structural: Not on file    Marital Status: Married    Current Medications:  Current Outpatient Medications:    acetaminophen  (TYLENOL ) 500 MG tablet, Take 500-1,000 mg by mouth every 8 (eight) hours as needed (pain.)., Disp: , Rfl:    ciprofloxacin (CIPRO) 500 MG tablet, Take 1 tablet (500 mg total) by mouth 2 (two) times daily., Disp: 14 tablet, Rfl: 0   estradiol (VIVELLE-DOT) 0.0375 MG/24HR, Place 1 patch onto the skin 2 (two) times a week., Disp: 8 patch, Rfl: 12   metroNIDAZOLE  (FLAGYL ) 500 MG tablet, Take 1 tablet (500 mg total) by mouth 2 (two) times daily., Disp: 14 tablet, Rfl: 0   polyethylene glycol powder (GLYCOLAX/MIRALAX) 17 GM/SCOOP powder, Take 17 g by mouth daily. Dissolve 1 capful (17g) in 4-8 ounces of liquid and take by mouth daily., Disp: , Rfl:    senna-docusate (SENOKOT-S) 8.6-50 MG tablet, Take 2 tablets by mouth at bedtime. For AFTER surgery, do not take if having diarrhea, Disp: 30 tablet, Rfl: 0  Review of Systems: + Constipation, hot flashes, headache Denies appetite changes, fevers, chills, fatigue, unexplained weight  changes. Denies hearing loss, neck lumps or masses, mouth sores, ringing in ears or voice changes. Denies cough or wheezing.  Denies shortness of breath. Denies chest pain or palpitations. Denies leg swelling. Denies abdominal distention, pain, blood in stools, diarrhea, nausea, vomiting, or early satiety. Denies pain with intercourse, dysuria, frequency, hematuria or incontinence. Denies pelvic pain, vaginal bleeding or vaginal discharge.   Denies joint pain, back pain or muscle pain/cramps. Denies itching, rash, or wounds. Denies dizziness,numbness or seizures. Denies swollen lymph nodes or glands, denies easy bruising or bleeding. Denies anxiety, depression, confusion, or decreased concentration.  Physical Exam: BP 137/83 (BP Location: Right Arm, Patient Position: Sitting)   Pulse 61   Temp 99.4 F (37.4 C) (Oral)   Resp 19   Wt 227 lb 12.8 oz (103.3 kg)   LMP 03/17/2024 (Approximate)   SpO2 100%   BMI 34.64 kg/m  General: Alert, oriented, no acute distress. HEENT: Posterior oropharynx clear, sclera anicteric. Chest: Unlabored breathing on room air.  Abdomen:  soft, nontender.  Normoactive bowel sounds.  No masses or hepatosplenomegaly appreciated.  Well-healed incisions. Extremities: Grossly normal range of motion.  Warm, well perfused.  No edema bilaterally. GU: Normal appearing external genitalia without erythema, excoriation, or lesions.  Speculum exam reveals suture intact along the right vaginal sidewall.  Minimal brown-tinged discharge within the vaginal vault.  Cuff is intact, very mildly erythematous.  Bimanual exam reveals cuff is moderately tender to palpation, no fluctuance.   Laboratory & Radiologic Studies: A. UTERUS, CERVIX, BILATERAL TUBES AND OVARIES: Endometrium     Inactive endometrium     Negative for endometrial intraepithelial neoplasia (EIN) and malignancy     See comment Cervix     Nabothian cysts     Negative for dysplasia Myometrium      Leiomyoma     Negative for malignancy Right ovary     Benign follicular cysts     Negative for endometriosis and malignancy Left ovary     Benign follicular cyst     Negative for endometriosis and malignancy Right fallopian tube     Unremarkable     Negative for endometriosis and malignancy Left fallopian tube     Benign paratubal cyst     Negative for endometriosis and malignancy  Comment: The entire endometrium is submitted for histologic examination and shows inactive endometrium with stromal changes consistent with previous biopsy or curettage.  There is no residual endometrial intraepithelial neoplasia (EIN) or evidence of malignancy.   Assessment & Plan: [name] is a 48 y.o. woman with EIN now s/p TRH/BSO with no residual EIN.  Patient is overall doing well although has more tenderness on exam and in the setting of some discharge as well as appearance of her cuff, discussed empirically treating her for vaginal cuff cellulitis.  Prescription for Cipro and Flagyl  sent to her pharmacy.  She continues to have hot flashes.  Has not had any further episodes of palpitations.  Discussed option of starting hormone replacement therapy.  She would like to do this.  We discussed starting a low to moderate dose and seeing if this controls her symptoms.  If symptoms persist, discussed that we can increase her dose.  Prescription for patch sent to her pharmacy.   Pathology from surgery with her.  She was given a copy of this report.  Discussed that given no residual EIN and no invasive cancer, no additional treatment or follow-up is indicated.  Patient will return on 10/21, right before she is scheduled to go back to work, for follow-up and repeat pelvic exam with Melissa.  22 minutes of total time was spent for this patient encounter, including preparation, face-to-face counseling with the patient and coordination of care, and documentation of the encounter.  Most of today's  visit was spent discussing empiric treatment for vaginal cuff cellulitis and initiating treatment for her menopausal symptoms with hormone replacement therapy.  Comer Dollar, MD  Division of Gynecologic Oncology  Department of Obstetrics and Gynecology  [facility] [place]

## 2024-09-17 NOTE — Patient Instructions (Signed)
 It was good to see you today.  You are healing well.  Given your tenderness on exam, I am sending in a prescription for 2 antibiotics that I like you to take for a week.  Please wait to start the hormone replacement therapy until you are at the end of that antibiotic treatment.  If you have any fevers, chills, drainage that looks or smells like pus, or any other concerning symptoms, please call before your follow-up visit with Melissa.  Please remember, no heavy lifting until 6 weeks after surgery and nothing in the vagina for at least 12 weeks.

## 2024-10-05 ENCOUNTER — Encounter: Payer: Self-pay | Admitting: Family Medicine

## 2024-10-05 ENCOUNTER — Other Ambulatory Visit: Payer: Self-pay | Admitting: Family Medicine

## 2024-10-05 DIAGNOSIS — Z1231 Encounter for screening mammogram for malignant neoplasm of breast: Secondary | ICD-10-CM

## 2024-10-05 NOTE — Patient Instructions (Incomplete)
 You are healing well from surgery. Please remember, no heavy lifting until 6 weeks after surgery and nothing in the vagina for at least 12 weeks.   We will prescribe diflucan, a tablet you can take to help treat the yeast, and also plan on using the topical yeast cream at least twice a day on clean, dry skin. There will be a refill on the diflucan if after several days, symptoms have not improved.  Please call if your symptoms persist. Also please keep us  posted on your estrogen patch dose and if you feel you need dose adjustments.  No follow up needed with our office unless needs arise in the future related to your surgery. Plan to follow up with your regular providers for your continued well woman care (Dr. Kandyce).

## 2024-10-05 NOTE — Progress Notes (Unsigned)
 Gynecologic Oncology Return Clinic Visit  10/06/24  Reason for Visit: follow-up post-op   Treatment History: The patient initially presented with abnormal uterine bleeding at the end of 2024.  Withdrawal bleed was induced with the use of Provera in May. 05/06/2024: Pelvic ultrasound exam at [facility] shows a uterus measuring 9.6 x 6.8 x 5.8 cm with a thickened endometrium measuring 17 mm.  Bilateral ovaries normal in appearance without masses.  No free fluid. 05/06/2024: Endometrial biopsy reveals menstrual endometrium. 06/24/2024: Endometrial biopsy with secretory exhausted glands and pseudo decidual response consistent with progestin administration.  No hyperplasia, carcinoma, or endometritis. Patient with continued bleeding on 15 mg of Aygestin despite 2 biopsies negative for hyperplasia or malignancy. 08/03/2024: Hysteroscopy with endometrial sampling performed.  On hysteroscopy, endometrium noted to be thick and vascular.  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   08/26/24: Robotic-assisted laparoscopic total hysterectomy with bilateral salpingo-oophorectomy, SLN mapping   Interval History: The patient presents today to the office for postoperative follow-up.  At her last visit with Dr. Comer Dollar, she was prescribed antibiotics for vaginal cuff cellulitis.  She tolerated the antibiotics well but feels she probably has a yeast infection now.  She has had vulvar itching and skin that has been more friable.  She is tolerating her diet with no issues.  Bowels and bladder are at baseline.  She does use intermittent laxatives for constipation if needed.  No vaginal bleeding or discharge reported.  Abdominal incisions have mild soreness at times but this is manageable and improving.  She continues to have moderate hot flashes especially at night. She started the estrogen patch on 09/30/24 and is currently on her second patch. She  is unable to tell a significant difference and would like to give move time on the current dose to see how her symptoms are.  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- surgery at childhood   CHOLECYSTECTOMY     DILATION AND CURETTAGE OF UTERUS  08/03/2024   FOOT SURGERY Right    HYSTERECTOMY, TOTAL, LAPAROSCOPIC, ROBOT-ASSISTED WITH SALPINGECTOMY Bilateral 08/26/2024   Procedure: HYSTERECTOMY, TOTAL, LAPAROSCOPIC, ROBOT-ASSISTED WITH SALPINGECTOMY;  Surgeon: Dollar Comer SAUNDERS, MD;  Location: [facility];  Service: Gynecology;  Laterality: Bilateral;  POSSIBLE BILATERAL OOPHORECTOMY   INJECTION, FOR SENTINEL LYMPH NODE IDENTIFICATION N/A 08/26/2024   Procedure: INJECTION, FOR SENTINEL LYMPH NODE IDENTIFICATION;  Surgeon: Dollar Comer SAUNDERS, MD;  Location: [facility];  Service: Gynecology;  Laterality: N/A;    Family History  Problem Relation Age of Onset   Lung cancer Mother    Breast cancer Mother    Diabetes Mother    Lung cancer Maternal Grandmother    Colon cancer Neg Hx    Esophageal cancer Neg Hx    Stomach cancer Neg Hx    Liver disease Neg Hx    Endometrial cancer Neg Hx    Ovarian cancer Neg Hx    Prostate cancer Neg Hx     Social History   Socioeconomic History   Marital status: Married    Spouse name: Not on file   Number of children: Not  on file   Years of education: Not on file   Highest education level: Some college, no degree  Occupational History   Not on file  Tobacco Use   Smoking status: Never   Smokeless tobacco: Never  Vaping Use   Vaping status: Never Used  Substance and Sexual Activity   Alcohol use: Yes    Comment: occ   Drug use: No   Sexual  activity: Yes    Comment: Not currently due to recent [facility]  Other Topics Concern   Not on file  Social History Narrative   Not on file   Social Drivers of Health   Financial Resource Strain: Low Risk  (08/21/2024)   Overall Financial Resource Strain (CARDIA)    Difficulty of Paying Living Expenses: Not hard at all  Food Insecurity: No Food Insecurity (08/21/2024)   Hunger Vital Sign    Worried About Running Out of Food in the Last Year: Never true    Ran Out of Food in the Last Year: Never true  Transportation Needs: No Transportation Needs (08/21/2024)   PRAPARE - Administrator, Civil Service (Medical): No    Lack of Transportation (Non-Medical): No  Physical Activity: Insufficiently Active (08/21/2024)   Exercise Vital Sign    Days of Exercise per Week: 3 days    Minutes of Exercise per Session: 30 min  Stress: Stress Concern Present (08/21/2024)   [employer] of Occupational Health - Occupational Stress Questionnaire    Feeling of Stress: To some extent  Social Connections: Moderately Isolated (08/21/2024)   Social Connection and Isolation Panel    Frequency of Communication with Friends and Family: Three times a week    Frequency of Social Gatherings with Friends and Family: Once a week    Attends Religious Services: Patient declined    Database administrator or Organizations: No    Attends Engineer, structural: Not on file    Marital Status: Married    Current Medications:  Current Outpatient Medications:    fluconazole (DIFLUCAN) 150 MG tablet, Plan on taking one tablet by mouth once. Can repeat in 5 days if symptoms have not improved, Disp: 2 tablet, Rfl: 0   acetaminophen  (TYLENOL ) 500 MG tablet, Take 500-1,000 mg by mouth every 8 (eight) hours as needed (pain.)., Disp: , Rfl:    ciprofloxacin (CIPRO) 500 MG tablet, Take 1 tablet (500 mg total) by mouth 2 (two) times daily., Disp: 14 tablet, Rfl: 0   estradiol (VIVELLE-DOT) 0.0375 MG/24HR, Place 1  patch onto the skin 2 (two) times a week., Disp: 8 patch, Rfl: 12   metroNIDAZOLE  (FLAGYL ) 500 MG tablet, Take 1 tablet (500 mg total) by mouth 2 (two) times daily., Disp: 14 tablet, Rfl: 0   polyethylene glycol powder (GLYCOLAX/MIRALAX) 17 GM/SCOOP powder, Take 17 g by mouth daily. Dissolve 1 capful (17g) in 4-8 ounces of liquid and take by mouth daily., Disp: , Rfl:    senna-docusate (SENOKOT-S) 8.6-50 MG tablet, Take 2 tablets by mouth at bedtime. For AFTER surgery, do not take if having diarrhea, Disp: 30 tablet, Rfl: 0  Review of Systems: + hot flashes, itching Denies appetite changes, fevers, chills, fatigue, unexplained weight changes. Denies hearing loss, neck lumps or masses, mouth sores, ringing in ears or voice changes. Denies cough or wheezing.  Denies shortness of breath. Denies chest pain or palpitations. Denies leg swelling. Denies abdominal distention, pain, blood in stools, diarrhea, nausea, vomiting, or early satiety. Denies pain with intercourse, dysuria,  frequency, hematuria or incontinence. Denies pelvic pain, vaginal bleeding or vaginal discharge.   Denies joint pain, back pain or muscle pain/cramps. Denies wounds. Denies dizziness,numbness or seizures. Denies swollen lymph nodes or glands, denies easy bruising or bleeding. Denies anxiety, depression, confusion, or decreased concentration.  Physical Exam: BP 132/86 (BP Location: Left Arm, Patient Position: Sitting)   Pulse 78   Temp 98.7 F (37.1 C) (Oral)   Resp 16   Ht 5' 8 (1.727 m)   Wt 230 lb (104.3 kg)   SpO2 100%   BMI 34.97 kg/m  General: Alert, oriented, no acute distress. Chest: Unlabored breathing on room air. Lungs clear. Heart regular in rate and rhythm. Abdomen:  Soft, nontender.  Normoactive bowel sounds.  No masses or hepatosplenomegaly appreciated.  Well-healed incisions without erythema or drainage. Extremities: Grossly normal range of motion.  Warm, well perfused.  No edema bilaterally. GU:  External genitalia with rash-like erythema bilaterally on the vulva.  Speculum exam reveals suture intact along the right vaginal sidewall.  Minimal white discharge within the vaginal vault.  Cuff is intact, no erythema.  Bimanual exam reveals cuff is intact, no fluctuance, no significant tenderness reported.   Laboratory & Radiologic Studies: A. UTERUS, CERVIX, BILATERAL TUBES AND OVARIES: Endometrium     Inactive endometrium     Negative for endometrial intraepithelial neoplasia (EIN) and malignancy     See comment Cervix     Nabothian cysts     Negative for dysplasia Myometrium     Leiomyoma     Negative for malignancy Right ovary     Benign follicular cysts     Negative for endometriosis and malignancy Left ovary     Benign follicular cyst     Negative for endometriosis and malignancy Right fallopian tube     Unremarkable     Negative for endometriosis and malignancy Left fallopian tube     Benign paratubal cyst     Negative for endometriosis and malignancy  Comment: The entire endometrium is submitted for histologic examination and shows inactive endometrium with stromal changes consistent with previous biopsy or curettage.  There is no residual endometrial intraepithelial neoplasia (EIN) or evidence of malignancy.   Assessment & Plan: [name] is a 48 y.o. woman with EIN now s/p TRH/BSO with no residual EIN.  Patient is overall doing well s/p treatment (Cipro and Flagyl ) for vaginal cuff cellulitis. Currently having symptoms of candidiasis. Will send in diflucan and she will use topical yeast cream she has at home.   She continues to have hot flashes. She started the estrogen patch on 09/30/24 and will monitor her symptoms. If symptoms persist, discussed that we can increase her dose.    She will be cleared to return to work on on 10/12/2024 with no restrictions to allow for additional healing time and treatment of candidiasis on today's examination.    She is advised to follow up with Dr. Kandyce and her PCP for her continued well woman care. No follow up needed at this time with GYN Oncology unless needs arise in the future.  15 minutes of total time was spent for this patient encounter, including preparation, face-to-face counseling with the patient and coordination of care, and documentation of the encounter.    [name] NP [facility] GYN Oncology

## 2024-10-06 ENCOUNTER — Inpatient Hospital Stay (HOSPITAL_BASED_OUTPATIENT_CLINIC_OR_DEPARTMENT_OTHER): Admitting: Gynecologic Oncology

## 2024-10-06 ENCOUNTER — Telehealth: Payer: Self-pay

## 2024-10-06 VITALS — BP 132/86 | HR 78 | Temp 98.7°F | Resp 16 | Ht 68.0 in | Wt 230.0 lb

## 2024-10-06 DIAGNOSIS — N8502 Endometrial intraepithelial neoplasia [EIN]: Secondary | ICD-10-CM

## 2024-10-06 DIAGNOSIS — Z9079 Acquired absence of other genital organ(s): Secondary | ICD-10-CM

## 2024-10-06 DIAGNOSIS — Z90722 Acquired absence of ovaries, bilateral: Secondary | ICD-10-CM

## 2024-10-06 DIAGNOSIS — Z90710 Acquired absence of both cervix and uterus: Secondary | ICD-10-CM

## 2024-10-06 DIAGNOSIS — B3731 Acute candidiasis of vulva and vagina: Secondary | ICD-10-CM

## 2024-10-06 MED ORDER — FLUCONAZOLE 150 MG PO TABS: ORAL_TABLET | ORAL | 0 refills | Status: DC

## 2024-10-06 NOTE — Telephone Encounter (Signed)
 Per [name] NP, ok for patient to return to work,w/o restrictions on Monday 10/27.   Work note written and faxed to [employer]. Pt is aware

## 2024-10-07 ENCOUNTER — Other Ambulatory Visit: Payer: Self-pay | Admitting: Gynecologic Oncology

## 2024-10-07 DIAGNOSIS — E8940 Asymptomatic postprocedural ovarian failure: Secondary | ICD-10-CM

## 2024-10-12 ENCOUNTER — Telehealth: Payer: Self-pay

## 2024-10-12 ENCOUNTER — Inpatient Hospital Stay: Admitting: Gynecologic Oncology

## 2024-10-12 VITALS — BP 134/80 | HR 71 | Temp 99.1°F | Resp 19 | Wt 229.8 lb

## 2024-10-12 DIAGNOSIS — N898 Other specified noninflammatory disorders of vagina: Secondary | ICD-10-CM | POA: Insufficient documentation

## 2024-10-12 DIAGNOSIS — R1084 Generalized abdominal pain: Secondary | ICD-10-CM

## 2024-10-12 DIAGNOSIS — N939 Abnormal uterine and vaginal bleeding, unspecified: Secondary | ICD-10-CM | POA: Insufficient documentation

## 2024-10-12 MED ORDER — PREDNISONE 50 MG PO TABS: ORAL_TABLET | ORAL | 0 refills | Status: DC

## 2024-10-12 MED ORDER — DIPHENHYDRAMINE HCL 50 MG PO TABS: 50.0000 mg | ORAL_TABLET | Freq: Once | ORAL | 0 refills | Status: DC

## 2024-10-12 NOTE — Patient Instructions (Addendum)
 Today we treated granulation tissue at the vaginal cuff which was bleeding. Given the pain you are having and the gushes, plan on having a CT scan to further evaluate.  Given your allergy to contrast, plan on taking the following regimen:   Three doses of 50 mg oral prednisone  administered 13 (3:30 am), 7 (8:30 am), and 1 hour (3:30 pm) prior to your CT scan and then 50 mg of benadryl orally administered 1 hour (3:30 pm) prior to your CT scan.  We will contact you with the results and recommendations moving forward.  Plan on no work for the rest of this week minimum while workup is taking place.   You will need to avoid heavy lifting, pushing and pulling for at least an additional week.   Continue with nothing in the vagina for the full 12 weeks.   We will plan on seeing you back in the office in 1-2 weeks to check the vaginal cuff incision again and for follow up.

## 2024-10-12 NOTE — Telephone Encounter (Signed)
 Per [name] NP, work note and notes from today's visit faxed to [employer]. Extended leave 10/27-10/31. Follow up appointment in 2 weeks

## 2024-10-12 NOTE — Telephone Encounter (Signed)
 Per [name] NP, I reached out to [name] for an appointment today.   Pt is scheduled for 2:00 today.

## 2024-10-12 NOTE — Telephone Encounter (Addendum)
 S/P Robotic-assisted laparoscopic total hysterectomy with bilateral salpingoophorectomy, SLN mapping on 9/10 with Dr.Tucker.   Follow up visit with Megan Epps NP on 10/21  [name] called the office today stating she was  active with her grandson yesterday(still under restrictions) and last evening she had a gush of bright red blood, it soaked her panties. No clots noted. Afterwards she started having constant abdominal pressure with sporadic stabbing pain. 6/10 on pain scale.She did take Tylenol  for pain with some relief. As the evening went on the bleeding was lighter.  This morning she is no longer having bleeding, but she is still having the constant pressure and stabbing pain. Still a 6/10. No fever/chills, no vaginal discharge. No intercourse, heavy lifting or exercise. No UTI S&S. Reports having a little vaginal itching and took a Diflucan (given by Megan Epps NP on 10/21) on Saturday. Reports she is on the Estradiol 0.0375mg  patch.  She called the after hours triage line last night and they told her to go to the ER, she opted to wait and call our office.   Aware message will be sent to provider and our office will call with advice/recommendations

## 2024-10-12 NOTE — Progress Notes (Signed)
 Gynecologic Oncology Return Clinic Visit  10/12/24  Reason for Visit: Vaginal bleeding, moderate abdominal pain   Treatment History: The patient initially presented with abnormal uterine bleeding at the end of 2024.  Withdrawal bleed was induced with the use of Provera in May. 05/06/2024: Pelvic ultrasound exam at [facility] shows a uterus measuring 9.6 x 6.8 x 5.8 cm with a thickened endometrium measuring 17 mm.  Bilateral ovaries normal in appearance without masses.  No free fluid. 05/06/2024: Endometrial biopsy reveals menstrual endometrium. 06/24/2024: Endometrial biopsy with secretory exhausted glands and pseudo decidual response consistent with progestin administration.  No hyperplasia, carcinoma, or endometritis. Patient with continued bleeding on 15 mg of Aygestin despite 2 biopsies negative for hyperplasia or malignancy. 08/03/2024: Hysteroscopy with endometrial sampling performed.  On hysteroscopy, endometrium noted to be thick and vascular.  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   08/26/24: Robotic-assisted laparoscopic total hysterectomy with bilateral salpingo-oophorectomy, SLN mapping.  Post-op, she was treated with Cipro and Flagyl  for vaginal cuff cellulitis.  Interval History: Patient presents today to the office for evaluation of vaginal bleeding and moderate abdominal/pelvic pain postop.  She states yesterday evening around 7 to 8:00 PM, she got off the couch and felt a gush vaginally.  She had another gush after that time.  States she had bright red blood that filled her underwear.  She started hurting once the bleeding began and describes this as pressure and sharp lower abdominal/pelvic pain intermittently.  Later in the evening and overnight she noticed pink discharge when wiping.  Today she did have more blood after having a bowel movement and notices more when moving around.  Overnight when her pain was at a  level 6-7 she took 2 Tylenol  which helped ease the discomfort slightly.  Bladder is functioning without difficulty.  She does struggle with intermittent constipation.  She reports having right groin discomfort as well.  She denies fever or chills.    States she has had a headache throughout the day on Sunday. She was active on Sunday playing with her grandson. She does not recall any heavy lifting or strenuous activity. Nothing has been placed in the vagina including intercourse.  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- surgery at childhood   CHOLECYSTECTOMY     DILATION AND CURETTAGE OF UTERUS  08/03/2024   FOOT SURGERY Right    HYSTERECTOMY, TOTAL, LAPAROSCOPIC, ROBOT-ASSISTED WITH SALPINGECTOMY Bilateral 08/26/2024   Procedure: HYSTERECTOMY, TOTAL, LAPAROSCOPIC, ROBOT-ASSISTED WITH SALPINGECTOMY;  Surgeon: Viktoria Comer SAUNDERS, MD;  Location: [facility];  Service: Gynecology;  Laterality: Bilateral;  POSSIBLE BILATERAL OOPHORECTOMY   INJECTION, FOR SENTINEL LYMPH NODE IDENTIFICATION N/A 08/26/2024   Procedure: INJECTION, FOR SENTINEL LYMPH NODE IDENTIFICATION;  Surgeon: Viktoria Comer SAUNDERS, MD;  Location: [facility];  Service: Gynecology;  Laterality: N/A;    Family History  Problem Relation Age of Onset   Lung cancer Mother    Breast cancer Mother    Diabetes Mother    Lung cancer Maternal Grandmother    Colon cancer Neg Hx    Esophageal cancer Neg Hx    Stomach cancer Neg  Hx    Liver disease Neg Hx    Endometrial cancer Neg Hx    Ovarian cancer Neg Hx    Prostate cancer Neg Hx     Social History   Socioeconomic History   Marital status: Married    Spouse name: Not on file    Number of children: Not on file   Years of education: Not on file   Highest education level: Some college, no degree  Occupational History   Not on file  Tobacco Use   Smoking status: Never   Smokeless tobacco: Never  Vaping Use   Vaping status: Never Used  Substance and Sexual Activity   Alcohol use: Yes    Comment: occ   Drug use: No   Sexual activity: Yes    Comment: Not currently due to recent D&C  Other Topics Concern   Not on file  Social History Narrative   Not on file   Social Drivers of Health   Financial Resource Strain: Low Risk  (08/21/2024)   Overall Financial Resource Strain (CARDIA)    Difficulty of Paying Living Expenses: Not hard at all  Food Insecurity: No Food Insecurity (08/21/2024)   Hunger Vital Sign    Worried About Running Out of Food in the Last Year: Never true    Ran Out of Food in the Last Year: Never true  Transportation Needs: No Transportation Needs (08/21/2024)   PRAPARE - Administrator, Civil Service (Medical): No    Lack of Transportation (Non-Medical): No  Physical Activity: Insufficiently Active (08/21/2024)   Exercise Vital Sign    Days of Exercise per Week: 3 days    Minutes of Exercise per Session: 30 min  Stress: Stress Concern Present (08/21/2024)   [employer] of Occupational Health - Occupational Stress Questionnaire    Feeling of Stress: To some extent  Social Connections: Moderately Isolated (08/21/2024)   Social Connection and Isolation Panel    Frequency of Communication with Friends and Family: Three times a week    Frequency of Social Gatherings with Friends and Family: Once a week    Attends Religious Services: Patient declined    Database Administrator or Organizations: No    Attends Engineer, Structural: Not on file    Marital Status: Married    Current Medications:  Current Outpatient Medications:    [START ON 10/13/2024] diphenhydrAMINE (BENADRYL) 50 MG tablet, Take 1 tablet (50 mg total) by  mouth once for 1 dose. Plan to take one hour before your CT scan., Disp: 1 tablet, Rfl: 0   [START ON 10/13/2024] predniSONE  (DELTASONE ) 50 MG tablet, Take one tablet at 13 hours, 7 hours, and 1 hour before your scan., Disp: 3 tablet, Rfl: 0   acetaminophen  (TYLENOL ) 500 MG tablet, Take 500-1,000 mg by mouth every 8 (eight) hours as needed (pain.)., Disp: , Rfl:    ciprofloxacin (CIPRO) 500 MG tablet, Take 1 tablet (500 mg total) by mouth 2 (two) times daily., Disp: 14 tablet, Rfl: 0   estradiol (VIVELLE-DOT) 0.0375 MG/24HR, PLACE 1 PATCH ONTO THE SKIN 2 TIMES A WEEK., Disp: 24 patch, Rfl: 5   fluconazole (DIFLUCAN) 150 MG tablet, Plan on taking one tablet by mouth once. Can repeat in 5 days if symptoms have not improved, Disp: 2 tablet, Rfl: 0   metroNIDAZOLE  (FLAGYL ) 500 MG tablet, Take 1 tablet (500 mg total) by mouth 2 (two) times daily., Disp: 14 tablet, Rfl: 0   polyethylene glycol powder (GLYCOLAX/MIRALAX)  17 GM/SCOOP powder, Take 17 g by mouth daily. Dissolve 1 capful (17g) in 4-8 ounces of liquid and take by mouth daily., Disp: , Rfl:    senna-docusate (SENOKOT-S) 8.6-50 MG tablet, Take 2 tablets by mouth at bedtime. For AFTER surgery, do not take if having diarrhea, Disp: 30 tablet, Rfl: 0  Review of Systems: See interval.  Physical Exam: BP 134/80 (BP Location: Right Arm, Patient Position: Sitting)   Pulse 71   Temp 99.1 F (37.3 C) (Oral)   Resp 19   Wt 229 lb 12.8 oz (104.2 kg)   SpO2 99%   BMI 34.94 kg/m  General: Alert, oriented, no acute distress. Chest: Unlabored breathing on room air. Lungs clear. Heart regular in rate and rhythm. Abdomen:  Soft, nontender.  Normoactive bowel sounds.  No masses or hepatosplenomegaly appreciated.  Well-healed incisions without erythema or drainage. Extremities: Grossly normal range of motion.  Warm, well perfused.  No significant edema bilaterally. GU: Normal external genitalia.  Speculum exam reveals intact vaginal cuff. 0.25 cm area of  granulation type tissue noted on the right vaginal cuff. With opening of the speculum, small amount of bright red bleeding noted in this area. Silver nitrate applied to the area of granulation tissue. Patient tolerated this well. Bimanual exam reveals cuff is intact, no fluctuance, no significant tenderness reported.   Laboratory & Radiologic Studies: A. UTERUS, CERVIX, BILATERAL TUBES AND OVARIES: Endometrium     Inactive endometrium     Negative for endometrial intraepithelial neoplasia (EIN) and malignancy     See comment Cervix     Nabothian cysts     Negative for dysplasia Myometrium     Leiomyoma     Negative for malignancy Right ovary     Benign follicular cysts     Negative for endometriosis and malignancy Left ovary     Benign follicular cyst     Negative for endometriosis and malignancy Right fallopian tube     Unremarkable     Negative for endometriosis and malignancy Left fallopian tube     Benign paratubal cyst     Negative for endometriosis and malignancy  Comment: The entire endometrium is submitted for histologic examination and shows inactive endometrium with stromal changes consistent with previous biopsy or curettage.  There is no residual endometrial intraepithelial neoplasia (EIN) or evidence of malignancy.   Assessment & Plan: [name] is a 48 y.o. woman with EIN now s/p TRH/BSO with no residual EIN.  Silver nitrate applied to area of granulation tissue on right vaginal cuff. No active bleeding noted after application. Given moderate to severe abdominal/pelvic pain and reported gushes of vaginal bleeding saturating her undergarments, plan for CT scan to evaluate for a pelvic fluid collection/hematoma.   Given the patient's contrast allergy, a prep of prednisone  at 13, 7, and 1 hour before her scan along with benadryl prescribed. She will have a driver to the scan esp since she is sensitive to medication. Reportable signs and symptoms reviewed.  Activity restrictions discussed. Given current medical situation requiring additional workup, she is advised to not return to work for the rest of this week at minimum. She is agreeable with the above plan. All questions answered. Symptoms prompting ER evaluation discussed.    20 minutes of total time was spent for this patient encounter, including preparation, face-to-face counseling with the patient and coordination of care, and documentation of the encounter.    [name] NP [facility] GYN Oncology

## 2024-10-13 ENCOUNTER — Ambulatory Visit (HOSPITAL_COMMUNITY)
Admission: RE | Admit: 2024-10-13 | Discharge: 2024-10-13 | Disposition: A | Discharge status: Home / Self Care | Source: Ambulatory Visit | Attending: Gynecologic Oncology | Admitting: Gynecologic Oncology

## 2024-10-13 DIAGNOSIS — N200 Calculus of kidney: Secondary | ICD-10-CM | POA: Diagnosis not present

## 2024-10-13 DIAGNOSIS — R1084 Generalized abdominal pain: Secondary | ICD-10-CM | POA: Diagnosis not present

## 2024-10-13 DIAGNOSIS — N939 Abnormal uterine and vaginal bleeding, unspecified: Secondary | ICD-10-CM | POA: Diagnosis not present

## 2024-10-13 DIAGNOSIS — K449 Diaphragmatic hernia without obstruction or gangrene: Secondary | ICD-10-CM | POA: Diagnosis not present

## 2024-10-13 MED ORDER — IOHEXOL 300 MG/ML  SOLN
100.0000 mL | Freq: Once | INTRAMUSCULAR | Status: AC | PRN
  Administered 2024-10-13: 100 mL via INTRAVENOUS

## 2024-10-14 ENCOUNTER — Telehealth: Payer: Self-pay | Admitting: *Deleted

## 2024-10-14 ENCOUNTER — Other Ambulatory Visit: Payer: Self-pay | Admitting: Gynecologic Oncology

## 2024-10-14 ENCOUNTER — Ambulatory Visit: Payer: Self-pay | Admitting: Gynecologic Oncology

## 2024-10-14 DIAGNOSIS — R590 Localized enlarged lymph nodes: Secondary | ICD-10-CM

## 2024-10-14 NOTE — Telephone Encounter (Signed)
 Spoke with [name] and relayed to patient that her repeat abd/pelvis CT scan has been scheduled for Thursday, January 29 th at 0800 with arrival time of 0745 at [facility]. Pt agreed to date and time and thanked the office.

## 2024-10-14 NOTE — Telephone Encounter (Signed)
 Called patient to discuss CT scan results. She reports having intermittent streaks of pink/blood when wiping but denies any further gushes of vaginal bleeding. Advised to monitor and call for any increases in bleeding, new symptoms. All questions answered. She has follow up arranged for 10/26/24 if needed.

## 2024-10-14 NOTE — Progress Notes (Signed)
 Given mild lymphadenopathy on recent CT (felt to be reactive), plan for repeat CT imaging in three months. Once date has been scheduled, prep for contrast allergy will be sent in.

## 2024-10-14 NOTE — Telephone Encounter (Signed)
 Spoke with Ms. Obi and relayed message from [name], NP that patient is cleared to return to work on Monday. Advised patient to avoid heavy lifting, and continue to follow all activity restrictions including no straining. Pt states she still has to strain at times. Advised patient to take 2 senokot-s twice daily with at least 8 oz of water  and add miralax 1 capful twice daily. Pt verbalized understanding.   We will plan for a repeat CT scan in 3 months to re-evaluate the lymph nodes that were slightly enlarged, felt to be reactive from recent surgery. Just to look at them after a certain time from surgery. Pt verbalized understanding and thanked the office for calling and is aware to call the office with any worsening symptoms.

## 2024-10-14 NOTE — Telephone Encounter (Signed)
 Spoke with Ms. Gellis who called the office stating [employer] has her out of work until Monday, November 3rd. Pt is taking it easy, she is still having vaginal bleeding/spotting only when she wipes and some abdominal pain that comes and goes. She wants to make sure she is ok to go back to work on Monday, November 3rd. Reports that she had her CT scan done yesterday and was told the results are ok. Advised patient her message will be relayed to providers and the office would call back. Pt thanked the office.

## 2024-10-15 ENCOUNTER — Other Ambulatory Visit: Payer: Self-pay | Admitting: Gynecologic Oncology

## 2024-10-15 DIAGNOSIS — R590 Localized enlarged lymph nodes: Secondary | ICD-10-CM

## 2024-10-15 DIAGNOSIS — N939 Abnormal uterine and vaginal bleeding, unspecified: Secondary | ICD-10-CM

## 2024-10-15 DIAGNOSIS — R1084 Generalized abdominal pain: Secondary | ICD-10-CM

## 2024-10-15 MED ORDER — PREDNISONE 50 MG PO TABS: ORAL_TABLET | ORAL | 0 refills | Status: DC

## 2024-10-15 MED ORDER — DIPHENHYDRAMINE HCL 50 MG PO TABS: 50.0000 mg | ORAL_TABLET | Freq: Once | ORAL | 0 refills | Status: DC

## 2024-10-15 NOTE — Progress Notes (Signed)
 Prep for CT scan in Jan 2026 prescribed.

## 2024-10-23 ENCOUNTER — Telehealth: Payer: Self-pay | Admitting: *Deleted

## 2024-10-23 NOTE — Telephone Encounter (Signed)
 Spoke with patient who called the office to cancel her Monday 11/10 appt. With [name], NP. Pt states she is no longer bleeding and is doing better. Advised patient her message will be relayed to provider and pt is aware to call the office with any future concerns.

## 2024-10-26 ENCOUNTER — Inpatient Hospital Stay: Admitting: Gynecologic Oncology

## 2024-10-26 DIAGNOSIS — N8502 Endometrial intraepithelial neoplasia [EIN]: Secondary | ICD-10-CM

## 2024-12-03 ENCOUNTER — Ambulatory Visit: Payer: Self-pay | Admitting: Family Medicine

## 2024-12-03 VITALS — BP 122/75 | HR 61 | Temp 97.9°F | Ht 68.0 in | Wt 232.0 lb

## 2024-12-03 DIAGNOSIS — Z6838 Body mass index (BMI) 38.0-38.9, adult: Secondary | ICD-10-CM

## 2024-12-03 DIAGNOSIS — Z0001 Encounter for general adult medical examination with abnormal findings: Secondary | ICD-10-CM | POA: Diagnosis not present

## 2024-12-03 DIAGNOSIS — E8940 Asymptomatic postprocedural ovarian failure: Secondary | ICD-10-CM | POA: Insufficient documentation

## 2024-12-03 DIAGNOSIS — E66812 Obesity, class 2: Secondary | ICD-10-CM | POA: Diagnosis not present

## 2024-12-03 DIAGNOSIS — E1165 Type 2 diabetes mellitus with hyperglycemia: Secondary | ICD-10-CM | POA: Diagnosis not present

## 2024-12-03 DIAGNOSIS — Z0000 Encounter for general adult medical examination without abnormal findings: Secondary | ICD-10-CM | POA: Diagnosis not present

## 2024-12-03 DIAGNOSIS — Z79890 Hormone replacement therapy (postmenopausal): Secondary | ICD-10-CM

## 2024-12-03 LAB — COMPREHENSIVE METABOLIC PANEL WITH GFR
ALT: 43 IU/L — ABNORMAL HIGH (ref 0–32)
AST: 27 IU/L (ref 0–40)
Albumin: 4.4 g/dL (ref 3.9–4.9)
Alkaline Phosphatase: 83 IU/L (ref 41–116)
BUN/Creatinine Ratio: 14 (ref 9–23)
BUN: 9 mg/dL (ref 6–24)
Bilirubin Total: 0.8 mg/dL (ref 0.0–1.2)
CO2: 24 mmol/L (ref 20–29)
Calcium: 9.5 mg/dL (ref 8.7–10.2)
Chloride: 98 mmol/L (ref 96–106)
Creatinine, Ser: 0.65 mg/dL (ref 0.57–1.00)
Globulin, Total: 2.1 g/dL (ref 1.5–4.5)
Glucose: 195 mg/dL — ABNORMAL HIGH (ref 70–99)
Potassium: 4.4 mmol/L (ref 3.5–5.2)
Sodium: 137 mmol/L (ref 134–144)
Total Protein: 6.5 g/dL (ref 6.0–8.5)
eGFR: 108 mL/min/1.73 (ref >59)

## 2024-12-03 LAB — CBC WITH DIFFERENTIAL/PLATELET
Basophils Absolute: 0.1 x10E3/uL (ref 0.0–0.2)
Basos: 1 %
EOS (ABSOLUTE): 0.2 x10E3/uL (ref 0.0–0.4)
Eos: 3 %
Hematocrit: 44.9 % (ref 34.0–46.6)
Hemoglobin: 14.3 g/dL (ref 11.1–15.9)
Immature Grans (Abs): 0 x10E3/uL (ref 0.0–0.1)
Immature Granulocytes: 0 %
Lymphocytes Absolute: 1.9 x10E3/uL (ref 0.7–3.1)
Lymphs: 30 %
MCH: 28 pg (ref 26.6–33.0)
MCHC: 31.8 g/dL (ref 31.5–35.7)
MCV: 88 fL (ref 79–97)
Monocytes Absolute: 0.7 x10E3/uL (ref 0.1–0.9)
Monocytes: 10 %
Neutrophils Absolute: 3.5 x10E3/uL (ref 1.4–7.0)
Neutrophils: 55 %
Platelets: 161 x10E3/uL (ref 150–450)
RBC: 5.1 x10E6/uL (ref 3.77–5.28)
RDW: 13.4 % (ref 11.7–15.4)
WBC: 6.3 x10E3/uL (ref 3.4–10.8)

## 2024-12-03 LAB — LIPID PANEL
Chol/HDL Ratio: 2.5 ratio (ref 0.0–4.4)
Cholesterol, Total: 135 mg/dL (ref 100–199)
HDL: 53 mg/dL (ref >39)
LDL Chol Calc (NIH): 71 mg/dL (ref 0–99)
Triglycerides: 49 mg/dL (ref 0–149)
VLDL Cholesterol Cal: 11 mg/dL (ref 5–40)

## 2024-12-03 LAB — TSH+FREE T4
Free T4: 1.18 ng/dL (ref 0.82–1.77)
TSH: 2.25 u[IU]/mL (ref 0.450–4.500)

## 2024-12-03 LAB — BAYER DCA HB A1C WAIVED: HB A1C (BAYER DCA - WAIVED): 8.1 % — ABNORMAL HIGH (ref 4.8–5.6)

## 2024-12-03 NOTE — Progress Notes (Signed)
 Subjective:  Patient ID: [name], female    DOB: [birth year]  Age: 48 y.o. MRN: [id number]  CC: Annual Exam   HPI  Discussed the use of AI scribe software for clinical note transcription with the patient, who gave verbal consent to proceed.  History of Present Illness [name] is a 48 year old female who presents for her annual physical examination.  She underwent a bilateral salpingectomy oophorectomy with hysterectomy approximately three months ago. She has recovered well, with no vaginal bleeding or endometriosis pain. She is experiencing post-menopausal symptoms and is using an estrogen patch, Vivelle -Dot 0.0375 mg, which has been effective in alleviating her symptoms.  She has a history of diabetes and has not been monitoring her blood sugar levels during her post-operative recovery. Her current HbA1c is 8.1. She believes she can manage her blood sugar levels through diet and is not interested in starting medication for diabetes at this time. presents forFollow-up of diabetes. Patient does not blood sugar at home.  Patient denies symptoms such as polyuria, polydipsia, excessive hunger, nausea No significant hypoglycemic spells noted. Medications reviewed. Pt reports taking them regularly without complication/adverse reaction being reported today.    Lab Results  Component Value Date   HGBA1C 6.4 (H) 08/24/2024   HGBA1C 6.8 (H) 11/21/2023   HGBA1C 6.0 (H) 12/14/2021             12/03/2024    8:11 AM 08/25/2024    3:26 PM 08/14/2024   11:38 AM  Depression screen PHQ 2/9  Decreased Interest 0 0 0  Down, Depressed, Hopeless 0 0 0  PHQ - 2 Score 0 0 0  Altered sleeping 0 0   Tired, decreased energy 0 1   Change in appetite 0 0   Feeling bad or failure about yourself  0 0   Trouble concentrating 0 0   Moving slowly or fidgety/restless 0 0   Suicidal thoughts 0 0   PHQ-9 Score 0 1    Difficult doing work/chores Not difficult at all Not  difficult at all      Data saved with a previous flowsheet row definition    History [name] has a past medical history of Abnormal uterine bleeding (AUB), Diabetes mellitus without complication (HCC), Family history of breast cancer, and GERD (gastroesophageal reflux disease).   She has a past surgical history that includes Cholecystectomy; Foot surgery (Right); bladder stent; Dilation and curettage of uterus (08/03/2024); Hysterectomy, total, laparoscopic, robot-assisted with salpingectomy (Bilateral, 08/26/2024); and Injection, for sentinel lymph node identification (N/A, 08/26/2024).   Her family history includes Breast cancer in her mother; Diabetes in her mother; Lung cancer in her maternal grandmother and mother.She reports that she has never smoked. She has never used smokeless tobacco. She reports current alcohol use. She reports that she does not use drugs.    ROS Review of Systems  Constitutional: Negative.   HENT:  Negative for congestion.   Eyes:  Negative for visual disturbance.  Respiratory:  Negative for shortness of breath.   Cardiovascular:  Negative for chest pain.  Gastrointestinal:  Negative for abdominal pain, constipation, diarrhea, nausea and vomiting.  Genitourinary:  Negative for difficulty urinating.  Musculoskeletal:  Negative for arthralgias and myalgias.  Neurological:  Negative for headaches.  Psychiatric/Behavioral:  Negative for sleep disturbance.     Objective:  BP 122/75   Pulse 61   Temp 97.9 F (36.6 C)   Ht 5' 8 (1.727 m)   Wt 232 lb (105.2 kg)  SpO2 98%   BMI 35.28 kg/m   BP Readings from Last 3 Encounters:  12/03/24 122/75  10/12/24 134/80  10/06/24 132/86    Wt Readings from Last 3 Encounters:  12/03/24 232 lb (105.2 kg)  10/12/24 229 lb 12.8 oz (104.2 kg)  10/06/24 230 lb (104.3 kg)     Physical Exam Constitutional:      General: She is not in acute distress.    Appearance: She is well-developed.  HENT:     Head:  Normocephalic and atraumatic.  Eyes:     Conjunctiva/sclera: Conjunctivae normal.     Pupils: Pupils are equal, round, and reactive to light.  Neck:     Thyroid : No thyromegaly.  Cardiovascular:     Rate and Rhythm: Normal rate and regular rhythm.     Heart sounds: Normal heart sounds. No murmur heard. Pulmonary:     Effort: Pulmonary effort is normal. No respiratory distress.     Breath sounds: Normal breath sounds. No wheezing or rales.  Abdominal:     General: Bowel sounds are normal. There is no distension.     Palpations: Abdomen is soft.     Tenderness: There is no abdominal tenderness.  Musculoskeletal:        General: Normal range of motion.     Cervical back: Normal range of motion and neck supple.  Lymphadenopathy:     Cervical: No cervical adenopathy.  Skin:    General: Skin is warm and dry.  Neurological:     Mental Status: She is alert and oriented to person, place, and time.  Psychiatric:        Behavior: Behavior normal.        Thought Content: Thought content normal.        Judgment: Judgment normal.   GYN, breast deferred due to recent hyst and ongoing care by GYN MD Physical Exam GENERAL: Alert, cooperative, well developed, no acute distress HEENT: Normocephalic, normal oropharynx, moist mucous membranes CHEST: Clear to auscultation bilaterally, No wheezes, rhonchi, or crackles CARDIOVASCULAR: Normal heart rate and rhythm, S1 and S2 normal without murmurs ABDOMEN: Soft, non-tender, non-distended, without organomegaly, Normal bowel sounds EXTREMITIES: No cyanosis or edema NEUROLOGICAL: Cranial nerves grossly intact, Moves all extremities without gross motor or sensory deficit   Assessment & Plan:  Annual physical exam -     Bayer DCA Hb A1c Waived -     Microalbumin / creatinine urine ratio -     Comprehensive metabolic panel with GFR -     CBC with Differential/Platelet -     Lipid panel -     TSH + free T4  Class 2 obesity without serious  comorbidity with body mass index (BMI) of 38.0 to 38.9 in adult, unspecified obesity type -     Comprehensive metabolic panel with GFR -     CBC with Differential/Platelet -     Lipid panel -     TSH + free T4  Uncontrolled type 2 diabetes mellitus with hyperglycemia (HCC) -     Bayer DCA Hb A1c Waived -     Microalbumin / creatinine urine ratio -     Comprehensive metabolic panel with GFR -     CBC with Differential/Platelet -     Lipid panel  Surgical menopause on hormone replacement therapy    Assessment and Plan Assessment & Plan Type 2 diabetes mellitus with hyperglycemia   A1c is elevated at 8.1, indicating poor glycemic control. She has not been monitoring blood  glucose levels post-operatively and is not on diabetes medication. She believes dietary modifications can improve glycemic control.  Menopausal state after hysterectomy and oophorectomy   She is post-menopausal following bilateral salpingectomy, oophorectomy, and hysterectomy performed three months ago. Experiencing menopausal symptoms managed effectively with an estrogen patch (Vivelle -Dot 0.0375 mg). Continue estrogen patch for symptom management.  General Health Maintenance   Annual physical examination conducted.       Follow-up: Return in about 3 months (around 03/03/2025).  Butler Der, M.D.

## 2024-12-04 LAB — MICROALBUMIN / CREATININE URINE RATIO
Creatinine, Urine: 95.7 mg/dL
Microalb/Creat Ratio: 3 mg/g{creat} (ref 0–29)
Microalbumin, Urine: 3.3 ug/mL

## 2024-12-06 ENCOUNTER — Ambulatory Visit: Payer: Self-pay | Admitting: Family Medicine

## 2024-12-06 NOTE — Progress Notes (Signed)
Hello Jerae,  Your lab result is normal and/or stable.Some minor variations that are not significant are commonly marked abnormal, but do not represent any medical problem for you.  Best regards, Corneluis Allston, M.D.

## 2024-12-07 ENCOUNTER — Ambulatory Visit

## 2025-01-01 ENCOUNTER — Other Ambulatory Visit: Payer: Self-pay | Admitting: Gynecologic Oncology

## 2025-01-01 DIAGNOSIS — Z91041 Radiographic dye allergy status: Secondary | ICD-10-CM

## 2025-01-01 MED ORDER — PREDNISONE 50 MG PO TABS: ORAL_TABLET | ORAL | 0 refills | Status: AC

## 2025-01-01 MED ORDER — DIPHENHYDRAMINE HCL 50 MG PO TABS: ORAL_TABLET | ORAL | 0 refills | Status: AC

## 2025-01-01 NOTE — Progress Notes (Addendum)
 Given your allergy to contrast, plan on taking the following regimen:    Three doses of 50 mg oral prednisone  administered 13, 7, and 1 hour prior to your CT scan and then 50 mg of benadryl  orally administered 1 hour prior to your CT scan.

## 2025-01-05 ENCOUNTER — Telehealth: Payer: Self-pay | Admitting: *Deleted

## 2025-01-05 NOTE — Telephone Encounter (Signed)
 Attempted to reach patient to remind of her CT scan and prednisone  instructions. Left message requesting call back to [phone].

## 2025-01-07 ENCOUNTER — Telehealth: Payer: Self-pay | Admitting: *Deleted

## 2025-01-07 NOTE — Telephone Encounter (Signed)
2nd attempt to reach patient. Left voicemail requesting call back.

## 2025-01-12 NOTE — Telephone Encounter (Signed)
 Spoke with patient who states she will be canceling and rescheduling her CT scan that is scheduled for this Thursday, 01/14/25.  Patient was given the number for radiology scheduling [phone]. Advised patient to call the office back once she has her CT scan rescheduled so that we may remind her of prednisone  instructions.

## 2025-01-12 NOTE — Telephone Encounter (Signed)
 3rd attempt to reach patient to relay medication instructions prior to her CT scan on 1/29. Left voicemail requesting call back to [phone].

## 2025-01-14 ENCOUNTER — Encounter (HOSPITAL_COMMUNITY): Payer: Self-pay

## 2025-01-14 ENCOUNTER — Ambulatory Visit (HOSPITAL_COMMUNITY): Attending: Gynecologic Oncology

## 2025-03-03 ENCOUNTER — Ambulatory Visit: Admitting: Family Medicine
